# Patient Record
Sex: Female | Born: 1960 | Race: White | Hispanic: No | Marital: Married | Smoking: Never smoker
Health system: Southern US, Community
[De-identification: ages and names within clinical notes are randomized; demographics above are authoritative.]

## PROBLEM LIST (undated history)

## (undated) DIAGNOSIS — K219 Gastro-esophageal reflux disease without esophagitis: Secondary | ICD-10-CM

## (undated) DIAGNOSIS — I10 Essential (primary) hypertension: Secondary | ICD-10-CM

## (undated) DIAGNOSIS — E7800 Pure hypercholesterolemia, unspecified: Secondary | ICD-10-CM

## (undated) DIAGNOSIS — D649 Anemia, unspecified: Secondary | ICD-10-CM

## (undated) DIAGNOSIS — E119 Type 2 diabetes mellitus without complications: Secondary | ICD-10-CM

## (undated) HISTORY — DX: Essential (primary) hypertension: I10

## (undated) HISTORY — PX: ENDOMETRIAL ABLATION: SHX621

## (undated) HISTORY — DX: Pure hypercholesterolemia, unspecified: E78.00

## (undated) HISTORY — PX: BLADDER SUSPENSION: SHX72

## (undated) HISTORY — DX: Type 2 diabetes mellitus without complications: E11.9

## (undated) HISTORY — DX: Gastro-esophageal reflux disease without esophagitis: K21.9

## (undated) HISTORY — PX: CHOLECYSTECTOMY: SHX55

---

## 2001-08-26 ENCOUNTER — Emergency Department (HOSPITAL_COMMUNITY): Admission: EM | Admit: 2001-08-26 | Discharge: 2001-08-26 | Payer: Self-pay | Admitting: Internal Medicine

## 2001-08-26 ENCOUNTER — Encounter: Payer: Self-pay | Admitting: Internal Medicine

## 2001-12-28 ENCOUNTER — Encounter: Admission: RE | Admit: 2001-12-28 | Discharge: 2001-12-28 | Payer: Self-pay | Admitting: General Surgery

## 2001-12-28 ENCOUNTER — Encounter: Payer: Self-pay | Admitting: General Surgery

## 2002-08-20 ENCOUNTER — Encounter: Admission: RE | Admit: 2002-08-20 | Discharge: 2002-08-20 | Payer: Self-pay | Admitting: Internal Medicine

## 2002-08-20 ENCOUNTER — Encounter: Payer: Self-pay | Admitting: Internal Medicine

## 2003-12-18 ENCOUNTER — Encounter: Admission: RE | Admit: 2003-12-18 | Discharge: 2003-12-18 | Payer: Self-pay | Admitting: Internal Medicine

## 2005-06-23 ENCOUNTER — Encounter: Admission: RE | Admit: 2005-06-23 | Discharge: 2005-06-23 | Payer: Self-pay | Admitting: Internal Medicine

## 2005-07-08 ENCOUNTER — Ambulatory Visit (HOSPITAL_COMMUNITY): Admission: RE | Admit: 2005-07-08 | Discharge: 2005-07-08 | Payer: Self-pay | Admitting: Internal Medicine

## 2006-09-09 ENCOUNTER — Ambulatory Visit (HOSPITAL_COMMUNITY): Admission: RE | Admit: 2006-09-09 | Discharge: 2006-09-09 | Payer: Self-pay | Admitting: Internal Medicine

## 2006-12-30 ENCOUNTER — Encounter: Admission: RE | Admit: 2006-12-30 | Discharge: 2006-12-30 | Payer: Self-pay | Admitting: Internal Medicine

## 2008-02-02 ENCOUNTER — Encounter: Admission: RE | Admit: 2008-02-02 | Discharge: 2008-02-02 | Payer: Self-pay | Admitting: Internal Medicine

## 2010-02-02 ENCOUNTER — Encounter: Admission: RE | Admit: 2010-02-02 | Discharge: 2010-02-02 | Payer: Self-pay | Admitting: Obstetrics and Gynecology

## 2010-10-15 ENCOUNTER — Ambulatory Visit (HOSPITAL_COMMUNITY)
Admission: RE | Admit: 2010-10-15 | Discharge: 2010-10-15 | Payer: Self-pay | Source: Home / Self Care | Attending: Obstetrics and Gynecology | Admitting: Obstetrics and Gynecology

## 2010-12-28 LAB — HCG, SERUM, QUALITATIVE: Preg, Serum: NEGATIVE

## 2010-12-28 LAB — CBC
HCT: 41.6 % (ref 36.0–46.0)
Hemoglobin: 14.5 g/dL (ref 12.0–15.0)
MCH: 27.2 pg (ref 26.0–34.0)
MCHC: 34.9 g/dL (ref 30.0–36.0)
MCV: 78 fL (ref 78.0–100.0)
Platelets: 312 10*3/uL (ref 150–400)
RBC: 5.33 MIL/uL — ABNORMAL HIGH (ref 3.87–5.11)
RDW: 14.3 % (ref 11.5–15.5)
WBC: 9.8 10*3/uL (ref 4.0–10.5)

## 2010-12-28 LAB — BASIC METABOLIC PANEL
BUN: 10 mg/dL (ref 6–23)
CO2: 29 mEq/L (ref 19–32)
Calcium: 9 mg/dL (ref 8.4–10.5)
Calcium: 9.1 mg/dL (ref 8.4–10.5)
GFR calc Af Amer: >60 mL/min (ref >60)
Sodium: 138 mEq/L (ref 135–145)

## 2011-03-30 ENCOUNTER — Other Ambulatory Visit: Payer: Self-pay | Admitting: Internal Medicine

## 2011-03-30 DIAGNOSIS — Z1231 Encounter for screening mammogram for malignant neoplasm of breast: Secondary | ICD-10-CM

## 2011-04-09 ENCOUNTER — Ambulatory Visit
Admission: RE | Admit: 2011-04-09 | Discharge: 2011-04-09 | Disposition: A | Discharge status: Home / Self Care | Payer: 59 | Source: Ambulatory Visit | Attending: Internal Medicine | Admitting: Internal Medicine

## 2011-04-09 DIAGNOSIS — Z1231 Encounter for screening mammogram for malignant neoplasm of breast: Secondary | ICD-10-CM

## 2013-05-31 ENCOUNTER — Other Ambulatory Visit: Payer: Self-pay

## 2013-05-31 DIAGNOSIS — Z1231 Encounter for screening mammogram for malignant neoplasm of breast: Secondary | ICD-10-CM

## 2013-07-03 ENCOUNTER — Ambulatory Visit
Admission: RE | Admit: 2013-07-03 | Discharge: 2013-07-03 | Disposition: A | Discharge status: Home / Self Care | Payer: 59 | Source: Ambulatory Visit

## 2013-07-03 DIAGNOSIS — Z1231 Encounter for screening mammogram for malignant neoplasm of breast: Secondary | ICD-10-CM

## 2013-07-11 ENCOUNTER — Telehealth: Payer: Self-pay

## 2013-07-11 NOTE — Telephone Encounter (Signed)
P was referred by Dr. Regino Schultze for screening colonoscopy. LMOM for a return call.

## 2013-07-12 NOTE — Telephone Encounter (Signed)
Pt's husband called to schedule the appt since he will be driving her. I told him I need her to call so I can triage and then we can get with him on a date. He said he will have her call me.

## 2013-07-13 NOTE — Telephone Encounter (Signed)
Pt has OV with AS on 08/06/2013 at 3:00 Pm for diarrhea and to schedule TCS.

## 2013-08-06 ENCOUNTER — Ambulatory Visit (INDEPENDENT_AMBULATORY_CARE_PROVIDER_SITE_OTHER): Payer: 59 | Admitting: Gastroenterology

## 2013-08-06 ENCOUNTER — Encounter (INDEPENDENT_AMBULATORY_CARE_PROVIDER_SITE_OTHER): Payer: Self-pay

## 2013-08-06 ENCOUNTER — Encounter: Payer: Self-pay | Admitting: Gastroenterology

## 2013-08-06 VITALS — BP 134/83 | HR 76 | Temp 97.2°F | Ht 63.0 in | Wt 188.8 lb

## 2013-08-06 DIAGNOSIS — Z1211 Encounter for screening for malignant neoplasm of colon: Secondary | ICD-10-CM

## 2013-08-06 MED ORDER — PEG 3350-KCL-NA BICARB-NACL 420 G PO SOLR: 4000.0000 mL | ORAL | Status: DC

## 2013-08-06 MED ORDER — DICYCLOMINE HCL 10 MG PO CAPS: 10.0000 mg | ORAL_CAPSULE | Freq: Three times a day (TID) | ORAL | Status: DC

## 2013-08-06 NOTE — Assessment & Plan Note (Signed)
52 year old female with need for initial screening colonoscopy and noting a change in bowel habits over the past year to include postprandial urgency, alternating soft and liquid stool. No rectal bleeding; she has had no exposure to antibiotics or changes in medication. Doubt dealing with an infectious process currently. She reports completing stool studies in the recent past; we will obtain these for our records if possible.   Start Bentyl in the interim.  Proceed with colonoscopy with Dr. Darrick Penna in the near future. The risks, benefits, and alternatives have been discussed in detail with the patient. They state understanding and desire to proceed.  As of note: patient wants to ensure we know that it takes "just a little" to sedate her.

## 2013-08-06 NOTE — Progress Notes (Signed)
Referring Provider: Dr. Regino Schultze Primary Gastroenterologist:  Dr. Darrick Penna   Chief Complaint  Patient presents with  . Colonoscopy  . Diarrhea    HPI:   Holly Higgins presents today to discuss an initial screening colonoscopy. Bowel habits changed in past year. Used to go every other day but now once a day to a couple times a day. Concerned due to fecal urgency, suddenness of need to use bathroom. No changes to medications. Sometimes will have complete liquid stool. Consistency of stool varies. Usually some type of liquid involved. If she doesn't take Prilosec or Zantac, notices loose stool may be worse. Occasional abdominal cramping associated with bowel habits but not often. Postprandial urgency at times. Notices lettuce may be linked to bowel habits. No rectal bleeding. No weight loss or lack of appetite. No dysphagia. Doesn't come out of anesthesia well per patient. Takes awhile to "come around". Had backed off of fish oil but saw no changes.   Past Medical History  Diagnosis Date  . Hypertension   . Hypercholesterolemia   . GERD (gastroesophageal reflux disease)     Past Surgical History  Procedure Laterality Date  . Cholecystectomy      remote past  . Incontinence surgery    . Endometrial ablation      Current Outpatient Prescriptions  Medication Sig Dispense Refill  . cetirizine (ZYRTEC) 10 MG tablet Take 10 mg by mouth daily.      . cholecalciferol (VITAMIN D) 1000 UNITS tablet Take 1,000 Units by mouth daily.      . ferrous sulfate 325 (65 FE) MG tablet Take 325 mg by mouth daily with breakfast.      . fish oil-omega-3 fatty acids 1000 MG capsule Take 2 g by mouth daily.      . hydrochlorothiazide (HYDRODIURIL) 25 MG tablet Take 25 mg by mouth daily.      Marland Kitchen omeprazole (PRILOSEC OTC) 20 MG tablet Take 20 mg by mouth daily.      . potassium chloride SA (K-DUR,KLOR-CON) 20 MEQ tablet Take 20 mEq by mouth daily.        No current facility-administered medications for this  visit.    Allergies as of 08/06/2013 - Review Complete 08/06/2013  Allergen Reaction Noted  . Morphine and related  08/06/2013  . Penicillins  08/06/2013  . Statins  08/06/2013    Family History  Problem Relation Age of Onset  . Colon cancer Neg Hx     History   Social History  . Marital Status: Married    Spouse Name: N/A    Number of Children: N/A  . Years of Education: N/A   Occupational History  . employed     [employer]   Social History Main Topics  . Smoking status: Never Smoker   . Smokeless tobacco: Not on file  . Alcohol Use: No  . Drug Use: No  . Sexual Activity: Not on file   Other Topics Concern  . Not on file   Social History Narrative  . No narrative on file    Review of Systems: Gen: Denies any fever, chills, loss of appetite, fatigue, weight loss. CV: Denies chest pain, heart palpitations, syncope, peripheral edema.  Resp: Denies shortness of breath with rest, cough, wheezing GI: see HPI GU : Denies urinary burning, urinary frequency, urinary incontinence.  MS: Denies joint pain, muscle weakness, cramps, limited movement Derm: Denies rash, itching, dry skin Psych: Denies depression, anxiety, confusion or memory loss  Heme: Denies  bruising, bleeding, and enlarged lymph nodes.  Physical Exam: BP 134/83  Pulse 76  Temp(Src) 97.2 F (36.2 C) (Oral)  Ht 5\' 3"  (1.6 m)  Wt 188 lb 12.8 oz (85.639 kg)  BMI 33.45 kg/m2 General:   Alert and oriented. Well-developed, well-nourished, pleasant and cooperative. Head:  Normocephalic and atraumatic. Eyes:  Conjunctiva pink, sclera clear, no icterus.   Conjunctiva pink. Ears:  Normal auditory acuity. Nose:  No deformity, discharge,  or lesions. Mouth:  No deformity or lesions, mucosa pink and moist.  Neck:  Supple, without mass or thyromegaly. Lungs:  Clear to auscultation bilaterally, without wheezing, rales, or rhonchi.  Heart:  S1, S2 present without murmurs noted.  Abdomen:  +BS, soft,  non-tender and non-distended. Without mass or HSM. No rebound or guarding. No hernias noted. Rectal:  Deferred  Msk:  Symmetrical without gross deformities. Normal posture. Extremities:  Without clubbing or edema. Neurologic:  Alert and  oriented x4;  grossly normal neurologically. Skin:  Intact, warm and dry without significant lesions or rashes Cervical Nodes:  No significant cervical adenopathy. Psych:  Alert and cooperative. Normal mood and affect.

## 2013-08-06 NOTE — Patient Instructions (Signed)
Start taking Bentyl with meals up to 4 times a day. This is for abdominal cramping and loose stool after eating. Monitor for constipation, dry mouth, dizziness.   We have scheduled you for a colonoscopy with Dr. Darrick Penna in the near future.  Start taking a probiotic daily. Some examples are: Digestive Advantage, Phillip's Colon Health, [employer] brand, Align, or Restora.

## 2013-08-07 NOTE — Progress Notes (Signed)
cc'd to pcp 

## 2013-08-13 ENCOUNTER — Encounter (HOSPITAL_COMMUNITY): Payer: Self-pay | Admitting: Pharmacy Technician

## 2013-08-14 ENCOUNTER — Other Ambulatory Visit: Payer: Self-pay

## 2013-08-14 DIAGNOSIS — R197 Diarrhea, unspecified: Secondary | ICD-10-CM

## 2013-08-14 NOTE — Progress Notes (Signed)
Pt called and was informed.  

## 2013-08-14 NOTE — Progress Notes (Signed)
LMOM for a return call. Containers and orders at front for pick-up.

## 2013-08-14 NOTE — Progress Notes (Signed)
Unable to retrieve stool studies from PCP.   Let's update Cdiff PCR, stool culture, Giardia if possible. Colonoscopy scheduled mid November.

## 2013-08-24 LAB — STOOL CULTURE

## 2013-08-26 NOTE — Progress Notes (Signed)
Quick Note:  Stool studies all negative.  Proceed with colonoscopy as planned. ______

## 2013-08-27 NOTE — Progress Notes (Signed)
Quick Note:  Called and informed pt. ______ 

## 2013-08-28 ENCOUNTER — Encounter (HOSPITAL_COMMUNITY): Payer: Self-pay | Admitting: *Deleted

## 2013-08-28 ENCOUNTER — Encounter (HOSPITAL_COMMUNITY)
Admission: RE | Disposition: A | Discharge status: Home / Self Care | Payer: Self-pay | Source: Ambulatory Visit | Attending: Gastroenterology

## 2013-08-28 ENCOUNTER — Ambulatory Visit (HOSPITAL_COMMUNITY)
Admission: RE | Admit: 2013-08-28 | Discharge: 2013-08-28 | Disposition: A | Discharge status: Home / Self Care | Payer: 59 | Source: Ambulatory Visit | Attending: Gastroenterology | Admitting: Gastroenterology

## 2013-08-28 DIAGNOSIS — Z1211 Encounter for screening for malignant neoplasm of colon: Secondary | ICD-10-CM | POA: Insufficient documentation

## 2013-08-28 DIAGNOSIS — K648 Other hemorrhoids: Secondary | ICD-10-CM

## 2013-08-28 DIAGNOSIS — I10 Essential (primary) hypertension: Secondary | ICD-10-CM | POA: Insufficient documentation

## 2013-08-28 DIAGNOSIS — E7800 Pure hypercholesterolemia, unspecified: Secondary | ICD-10-CM | POA: Insufficient documentation

## 2013-08-28 DIAGNOSIS — D126 Benign neoplasm of colon, unspecified: Secondary | ICD-10-CM

## 2013-08-28 DIAGNOSIS — D179 Benign lipomatous neoplasm, unspecified: Secondary | ICD-10-CM | POA: Insufficient documentation

## 2013-08-28 DIAGNOSIS — R197 Diarrhea, unspecified: Secondary | ICD-10-CM

## 2013-08-28 HISTORY — DX: Anemia, unspecified: D64.9

## 2013-08-28 HISTORY — PX: COLONOSCOPY: SHX5424

## 2013-08-28 SURGERY — COLONOSCOPY
Anesthesia: Moderate Sedation

## 2013-08-28 MED ORDER — MIDAZOLAM HCL 5 MG/5ML IJ SOLN
INTRAMUSCULAR | Status: AC
  Filled 2013-08-28: qty 10

## 2013-08-28 MED ORDER — STERILE WATER FOR IRRIGATION IR SOLN
Status: DC | PRN
  Administered 2013-08-28: 09:00:00

## 2013-08-28 MED ORDER — MEPERIDINE HCL 100 MG/ML IJ SOLN
INTRAMUSCULAR | Status: AC
  Filled 2013-08-28: qty 2

## 2013-08-28 MED ORDER — SODIUM CHLORIDE 0.9 % IV SOLN
INTRAVENOUS | Status: DC
  Administered 2013-08-28: 09:00:00 via INTRAVENOUS

## 2013-08-28 MED ORDER — MEPERIDINE HCL 100 MG/ML IJ SOLN
INTRAMUSCULAR | Status: DC | PRN
  Administered 2013-08-28: 50 mg via INTRAVENOUS
  Administered 2013-08-28: 25 mg via INTRAVENOUS

## 2013-08-28 MED ORDER — MIDAZOLAM HCL 5 MG/5ML IJ SOLN
INTRAMUSCULAR | Status: DC | PRN
  Administered 2013-08-28: 1 mg via INTRAVENOUS
  Administered 2013-08-28 (×2): 2 mg via INTRAVENOUS

## 2013-08-28 NOTE — Op Note (Signed)
[facility] [address] [place], [address]   COLONOSCOPY PROCEDURE REPORT  PATIENT: Holly, Higgins  MR#: [id number] BIRTHDATE: 1961/03/18 , 52  yrs. old GENDER: Female ENDOSCOPIST: Jonette Eva, MD REFERRED [id number] Regino Schultze, M.D. PROCEDURE DATE:  08/28/2013 PROCEDURE:   Colonoscopy with snare polypectomy and Colonoscopy with biopsy INDICATIONS:unexplained diarrhea and Colorectal cancer screening. MEDICATIONS: Demerol 75 mg IV and Versed 5 mg IV  DESCRIPTION OF PROCEDURE:    Physical exam was performed.  Informed consent was obtained from the patient after explaining the benefits, risks, and alternatives to procedure.  The patient was connected to monitor and placed in left lateral position. Continuous oxygen was provided by nasal cannula and IV medicine administered through an indwelling cannula.  After administration of sedation and rectal exam, the patients rectum was intubated and the EC-3890Li ([id number])  colonoscope was advanced under direct visualization to the ileum.  The scope was removed slowly by carefully examining the color, texture, anatomy, and integrity mucosa on the way out. THE POLYP WAS RETRIEVED AND THE ROTH NET REMOVED.  AGAIN THE PATIENT rectum was intubated and the EC-3890Li ([id number])  colonoscope was advanced under direct visualization to the HEPATIC FLEXURE. The color, texture, anatomy, and integrity mucosa on the way out.  The patient was recovered in endoscopy and discharged home in satisfactory condition.    COLON FINDINGS: The mucosa appeared normal in the terminal ileum.  , A firm sessile polyp measuring 1 cm in size was found at the cecum. Multiple biopsies were performed using cold forceps.  A polypectomy was performed using snare cautery. THE POLYP WAS RETRIEVED USING THE ROTH NET.  The colon mucosa was otherwise normal.  , The colon IS redundant.  Manual abdominal counter-pressure was used to reach the cecum, and Moderate  sized internal hemorrhoids were found.  PREP QUALITY: good CECAL W/D TIME: 22 minutes    COMPLICATIONS: None  ENDOSCOPIC IMPRESSION: 1.   ONE COLON POLYP REMOVED 2.   The colon IS redundant 3.   Moderate sized internal hemorrhoids  RECOMMENDATIONS: FOLLOW A HIGH FIBER DIET.  AVOID ITEMS THAT CAUSE BLOATING AND GAS. BIOPSY RESULTS SHOULD BE BACK IN 7 DAYS.  Next colonoscopy in 3 YEARS IF SIMPLE ADENOMA AND 10 years IF INFLAMMATORY POLYP.       _______________________________ Rosalie DoctorJonette Eva, MD 08/28/2013 12:16 PM

## 2013-08-28 NOTE — H&P (Signed)
  Primary Care Physician:  Kirk Ruths, MD Primary Gastroenterologist:  Dr. Darrick Penna  Pre-Procedure History & Physical: HPI:  Holly Higgins is a 52 y.o. female here for COLON CANCER SCREENING.  Past Medical History  Diagnosis Date  . Hypertension   . Hypercholesterolemia   . GERD (gastroesophageal reflux disease)   . Anemia     Past Surgical History  Procedure Laterality Date  . Cholecystectomy      remote past  . Endometrial ablation    . Bladder suspension      Prior to Admission medications   Medication Sig Start Date End Date Taking? Authorizing Provider  dicyclomine (BENTYL) 10 MG capsule Take 1 capsule (10 mg total) by mouth 4 (four) times daily -  before meals and at bedtime. 08/06/13  Yes Nira Retort, NP  hydrochlorothiazide (HYDRODIURIL) 25 MG tablet Take 25 mg by mouth daily.   Yes Historical Provider, MD  omeprazole (PRILOSEC OTC) 20 MG tablet Take 20 mg by mouth daily.   Yes Historical Provider, MD  polyethylene glycol-electrolytes (TRILYTE) 420 G solution Take 4,000 mLs by mouth as directed. 08/06/13  Yes West Bali, MD  cetirizine (ZYRTEC) 10 MG tablet Take 10 mg by mouth daily.    Historical Provider, MD  ferrous sulfate 325 (65 FE) MG tablet Take 325 mg by mouth daily with breakfast.    Historical Provider, MD  fish oil-omega-3 fatty acids 1000 MG capsule Take 2 g by mouth daily.    Historical Provider, MD  potassium chloride SA (K-DUR,KLOR-CON) 20 MEQ tablet Take 20 mEq by mouth daily.  06/01/13   Historical Provider, MD  VITAMIN D, CHOLECALCIFEROL, PO Take 1 tablet by mouth daily.    Historical Provider, MD    Allergies as of 08/06/2013 - Review Complete 08/06/2013  Allergen Reaction Noted  . Morphine and related  08/06/2013  . Penicillins  08/06/2013  . Statins  08/06/2013    Family History  Problem Relation Age of Onset  . Colon cancer Neg Hx     History   Social History  . Marital Status: Married    Spouse Name: N/A    Number of Children:  N/A  . Years of Education: N/A   Occupational History  . employed     [employer]   Social History Main Topics  . Smoking status: Never Smoker   . Smokeless tobacco: Not on file  . Alcohol Use: No  . Drug Use: No  . Sexual Activity: Not on file   Other Topics Concern  . Not on file   Social History Narrative  . No narrative on file    Review of Systems: See HPI, otherwise negative ROS   Physical Exam: BP 127/77  Pulse 75  Temp(Src) 97.5 F (36.4 C) (Oral)  Resp 16  Ht 5\' 3"  (1.6 m)  Wt 188 lb (85.276 kg)  BMI 33.31 kg/m2  SpO2 95% General:   Alert,  pleasant and cooperative in NAD Head:  Normocephalic and atraumatic. Neck:  Supple; Lungs:  Clear throughout to auscultation.    Heart:  Regular rate and rhythm. Abdomen:  Soft, nontender and nondistended. Normal bowel sounds, without guarding, and without rebound.   Neurologic:  Alert and  oriented x4;  grossly normal neurologically.  Impression/Plan:     SCREENING  Plan:  1. TCS TODAY

## 2013-08-30 ENCOUNTER — Encounter (HOSPITAL_COMMUNITY): Payer: Self-pay | Admitting: Gastroenterology

## 2013-09-05 ENCOUNTER — Telehealth: Payer: Self-pay | Admitting: Gastroenterology

## 2013-09-05 NOTE — Telephone Encounter (Signed)
Please call pt. She had a polypoid lesion, removed and it was benign. TCS in 10 years. FOLLOW A High fiber diet.

## 2013-09-06 NOTE — Telephone Encounter (Signed)
Pt returned call and was informed.  

## 2013-09-06 NOTE — Telephone Encounter (Signed)
Cc PCP 

## 2013-09-06 NOTE — Telephone Encounter (Signed)
LM for pt to call

## 2014-04-16 ENCOUNTER — Other Ambulatory Visit (HOSPITAL_COMMUNITY): Payer: Self-pay | Admitting: Family Medicine

## 2014-04-16 ENCOUNTER — Encounter (HOSPITAL_COMMUNITY): Payer: Self-pay

## 2014-04-16 ENCOUNTER — Ambulatory Visit (HOSPITAL_COMMUNITY)
Admission: RE | Admit: 2014-04-16 | Discharge: 2014-04-16 | Disposition: A | Discharge status: Home / Self Care | Payer: 59 | Source: Ambulatory Visit | Attending: Family Medicine | Admitting: Family Medicine

## 2014-04-16 DIAGNOSIS — G43809 Other migraine, not intractable, without status migrainosus: Secondary | ICD-10-CM

## 2014-04-16 DIAGNOSIS — G43009 Migraine without aura, not intractable, without status migrainosus: Secondary | ICD-10-CM

## 2014-04-16 DIAGNOSIS — G43909 Migraine, unspecified, not intractable, without status migrainosus: Secondary | ICD-10-CM | POA: Insufficient documentation

## 2014-06-14 ENCOUNTER — Other Ambulatory Visit: Payer: Self-pay

## 2014-06-14 DIAGNOSIS — Z1231 Encounter for screening mammogram for malignant neoplasm of breast: Secondary | ICD-10-CM

## 2014-07-15 ENCOUNTER — Ambulatory Visit
Admission: RE | Admit: 2014-07-15 | Discharge: 2014-07-15 | Disposition: A | Discharge status: Home / Self Care | Payer: 59 | Source: Ambulatory Visit

## 2014-07-15 DIAGNOSIS — Z1231 Encounter for screening mammogram for malignant neoplasm of breast: Secondary | ICD-10-CM

## 2014-09-01 ENCOUNTER — Other Ambulatory Visit: Payer: Self-pay | Admitting: Gastroenterology

## 2014-09-15 IMAGING — CT CT HEAD W/O CM
1 series · 16 of 30 positions shown, 20 images · non-contrast
Comparison: CT brain scan of 09/09/2006

CLINICAL DATA: Migraine headaches, photosensitivity

EXAM:
CT HEAD WITHOUT CONTRAST
TECHNIQUE: Contiguous axial images were obtained from the base of the skull
through the vertex without intravenous contrast.

[Series 2: headseq 4.8 h37s · axial · 0.43mm/px · z∈[+1026,+1184]mm · 16 of 36 slices shown, 20 images]
[im 2/36  brain]
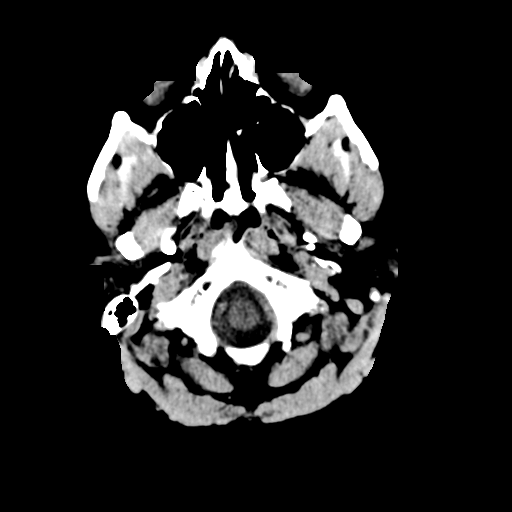
[im 2/36  bone]
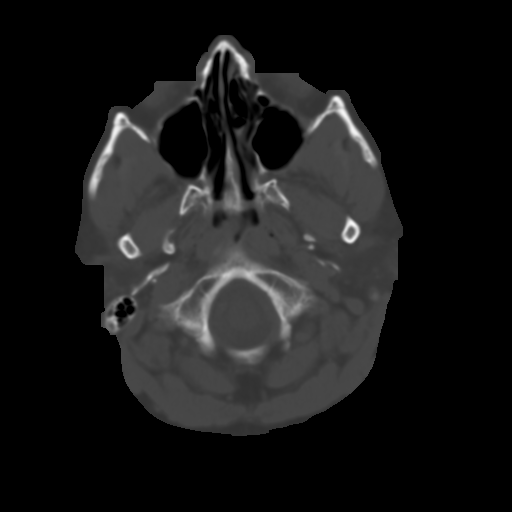
[im 4/36  brain]
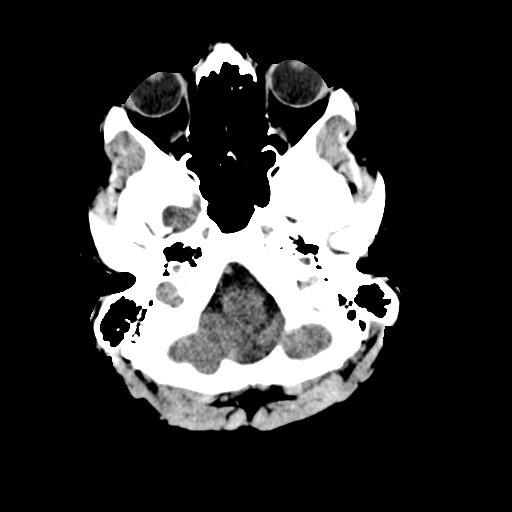
[im 7/36  brain]
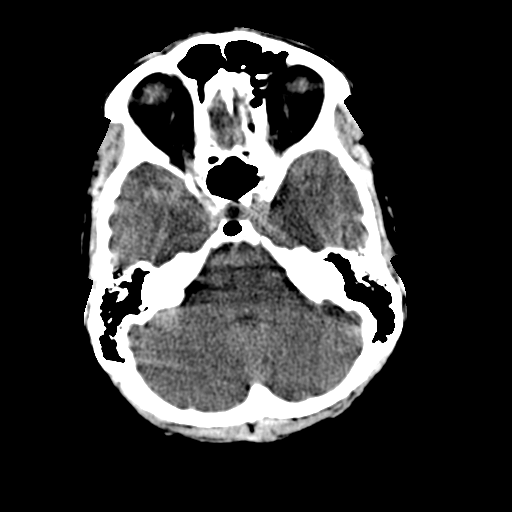
[im 9/36  brain]
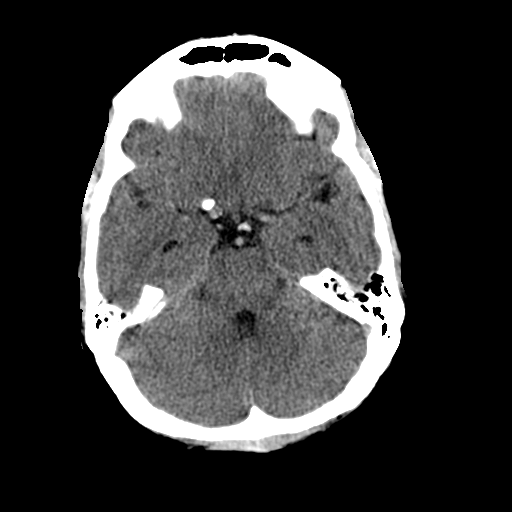
[im 10/36  brain]
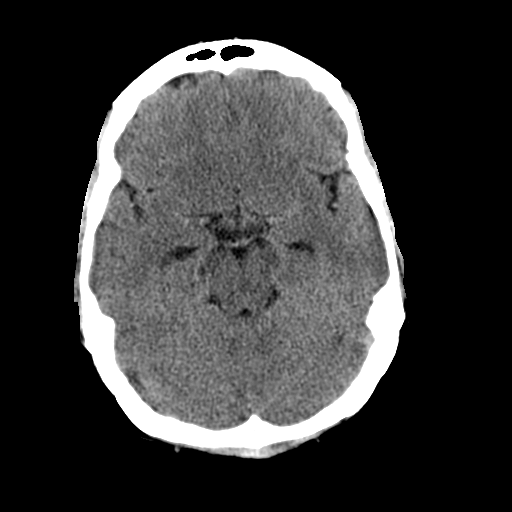
[im 10/36  bone]
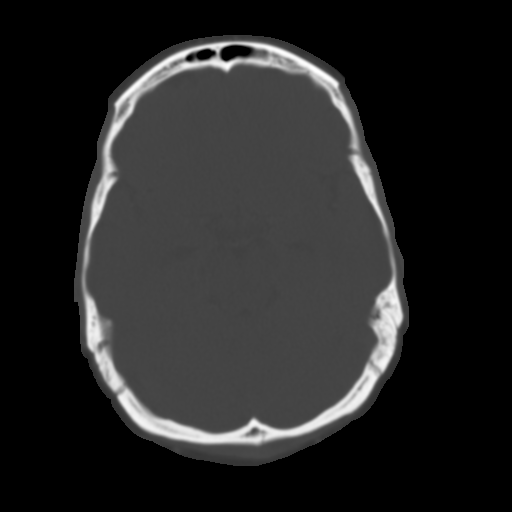
[im 13/36  brain]
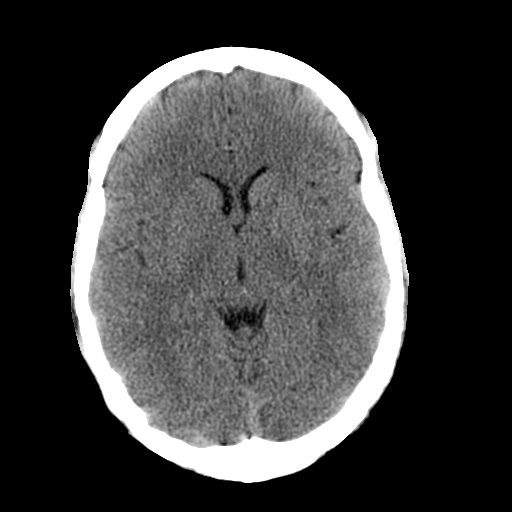
[im 15/36  brain]
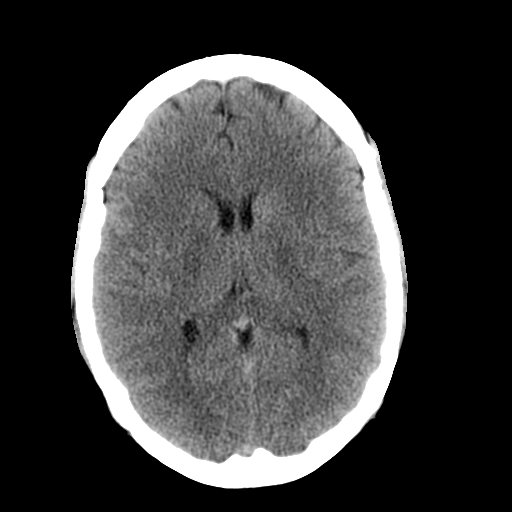
[im 17/36  brain]
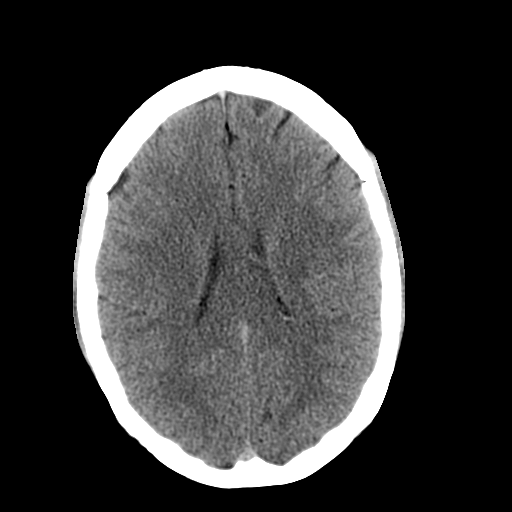
[im 19/36  brain]
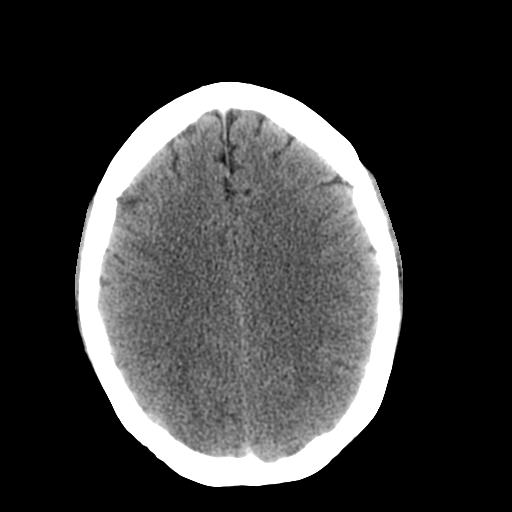
[im 19/36  bone]
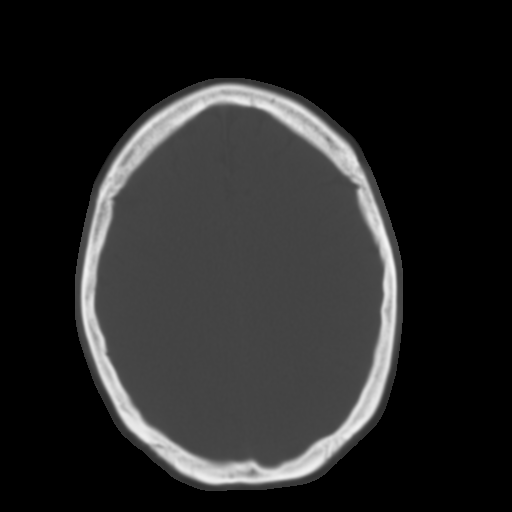
[im 21/36  brain]
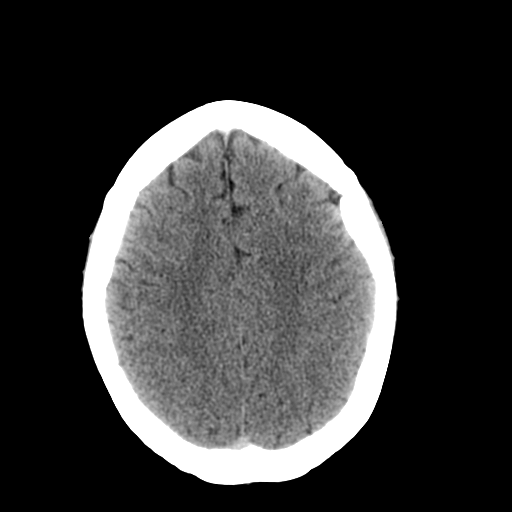
[im 23/36  brain]
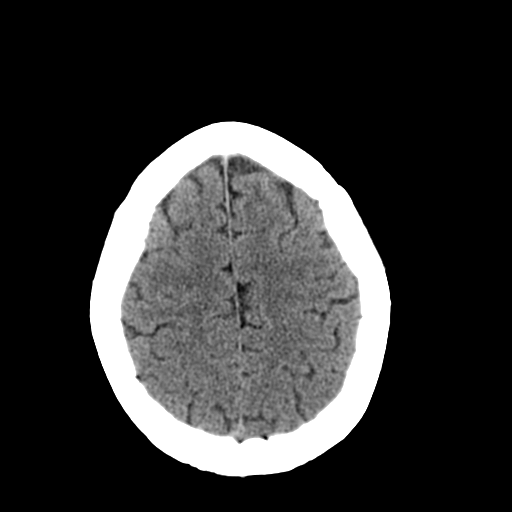
[im 26/36  brain]
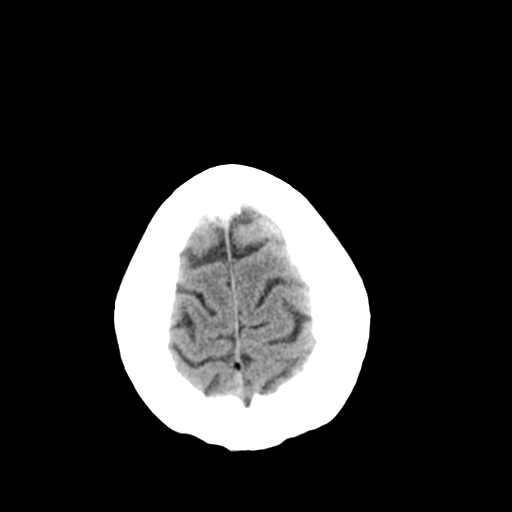
[im 27/36  brain]
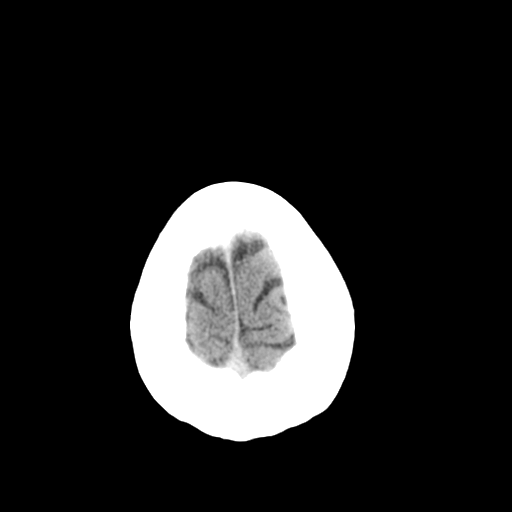
[im 27/36  bone]
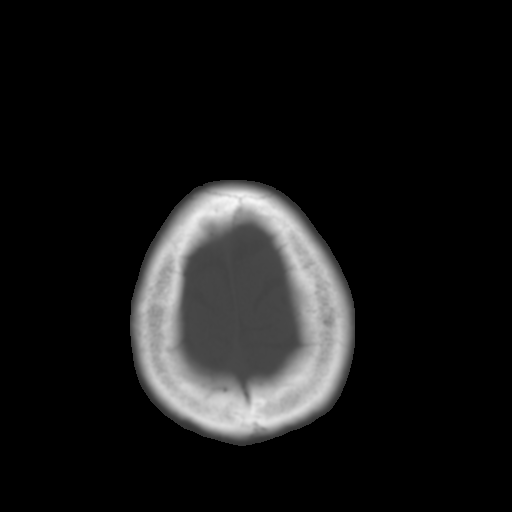
[im 29/36  brain]
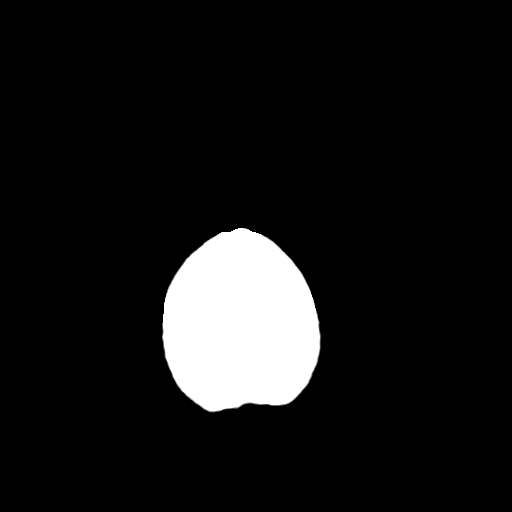
[im 32/36  brain]
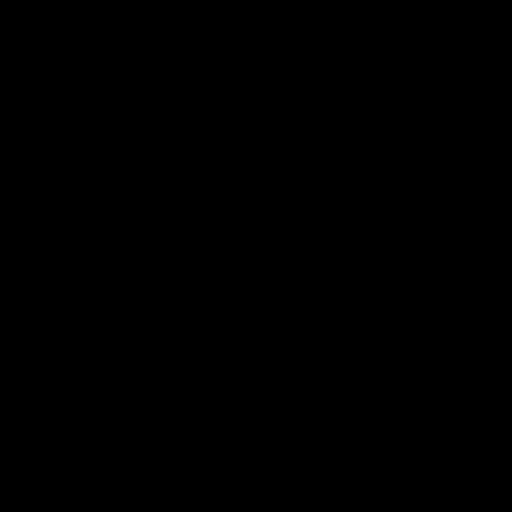
[im 34/36  brain]
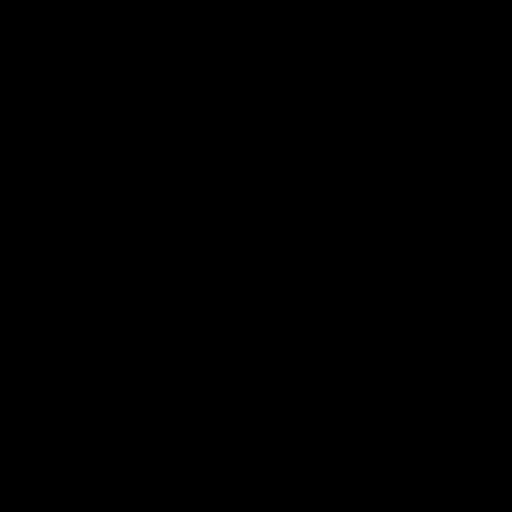

[16 of 30 positions shown; findings below may reference images not displayed]

FINDINGS: The ventricular system is normal in size and configuration and the
septum is in a normal midline position. The fourth ventricle and
basilar cisterns are unremarkable. No hemorrhage, mass lesion, or
acute infarction is seen. On bone window images, no calvarial
abnormality is noted. The paranasal sinuses appear well pneumatized.
IMPRESSION: Negative unenhanced CT of the brain.

## 2014-12-06 ENCOUNTER — Other Ambulatory Visit (HOSPITAL_COMMUNITY): Payer: Self-pay | Admitting: Internal Medicine

## 2014-12-06 DIAGNOSIS — R0600 Dyspnea, unspecified: Secondary | ICD-10-CM

## 2014-12-07 ENCOUNTER — Ambulatory Visit (HOSPITAL_COMMUNITY): Payer: Self-pay

## 2015-02-04 ENCOUNTER — Institutional Professional Consult (permissible substitution): Payer: Self-pay | Admitting: Neurology

## 2015-03-12 ENCOUNTER — Other Ambulatory Visit (HOSPITAL_COMMUNITY): Payer: Self-pay | Admitting: Radiology

## 2015-03-12 DIAGNOSIS — G4730 Sleep apnea, unspecified: Secondary | ICD-10-CM

## 2015-03-28 ENCOUNTER — Ambulatory Visit: Payer: 59 | Attending: Neurology | Admitting: Sleep Medicine

## 2015-03-28 DIAGNOSIS — G4730 Sleep apnea, unspecified: Secondary | ICD-10-CM | POA: Insufficient documentation

## 2015-03-29 NOTE — Sleep Study (Signed)
  [place] A. Merlene Laughter, MD     [email or web address]        NOCTURNAL POLYSOMNOGRAM    LOCATION: SLEEP LAB FACILITY: [facility]   PHYSICIAN: Loukas Antonson A. Merlene Laughter, M.D.   DATE OF STUDY: 03/28/2015.   REFERRING PHYSICIAN: Chade Pitner.   INDICATIONS: The patient is a 54 year old presents with hypersomnia, obesity and snoring.  MEDICATIONS:  Prior to Admission medications   Medication Sig Start Date End Date Taking? Authorizing Provider  cetirizine (ZYRTEC) 10 MG tablet Take 10 mg by mouth daily.    Historical Provider, MD  dicyclomine (BENTYL) 10 MG capsule TAKE 1 CAPSULE BY MOUTH FOUR TIMES DAILY - BEFORE MEALS AND AT BEDTIME 09/02/14   Mahala Menghini, PA-C  ferrous sulfate 325 (65 FE) MG tablet Take 325 mg by mouth daily with breakfast.    Historical Provider, MD  fish oil-omega-3 fatty acids 1000 MG capsule Take 2 g by mouth daily.    Historical Provider, MD  hydrochlorothiazide (HYDRODIURIL) 25 MG tablet Take 25 mg by mouth daily.    Historical Provider, MD  omeprazole (PRILOSEC OTC) 20 MG tablet Take 20 mg by mouth daily.    Historical Provider, MD  potassium chloride SA (K-DUR,KLOR-CON) 20 MEQ tablet Take 20 mEq by mouth daily.  06/01/13   Historical Provider, MD  VITAMIN D, CHOLECALCIFEROL, PO Take 1 tablet by mouth daily.    Historical Provider, MD      EPWORTH SLEEPINESS SCALE: 11.   BMI: 35.   ARCHITECTURAL SUMMARY: Total recording time was 409 minutes. Sleep efficiency 92 %. Sleep latency 6 minutes. REM latency 94 minutes. Stage NI 1 %, N2 48 % and N3 27 % and REM sleep 23 %.    RESPIRATORY DATA:  Baseline oxygen saturation is 94 %. The lowest saturation is 84 %. The diagnostic AHI is 24. The RDI is 25. The REM AHI is 31.  LIMB MOVEMENT SUMMARY: PLM index 0.   ELECTROCARDIOGRAM SUMMARY: Average heart rate is 74 with no significant dysrhythmias observed.   IMPRESSION:  1. Moderate obstructive sleep apnea syndrome. CPAP therapy is  recommended.  Thanks for this referral.  Antwanette Wesche A. Merlene Laughter, M.D. Diplomat, Tax adviser of Sleep Medicine.

## 2015-05-09 NOTE — Progress Notes (Signed)
REVIEWED. TCS NOV 2014

## 2017-01-05 ENCOUNTER — Other Ambulatory Visit: Payer: Self-pay | Admitting: Gastroenterology

## 2017-01-05 NOTE — Telephone Encounter (Signed)
I called pt and informed her. She said she will call back to schedule appt when she has her calendar in front of her.

## 2017-01-05 NOTE — Telephone Encounter (Signed)
Patient last seen in 2014. No refills without ov.

## 2020-04-29 ENCOUNTER — Other Ambulatory Visit: Payer: Self-pay

## 2020-04-29 ENCOUNTER — Ambulatory Visit (INDEPENDENT_AMBULATORY_CARE_PROVIDER_SITE_OTHER): Payer: Commercial Managed Care - PPO | Admitting: Internal Medicine

## 2020-04-29 ENCOUNTER — Encounter: Payer: Self-pay | Admitting: Internal Medicine

## 2020-04-29 VITALS — BP 128/80 | HR 63 | Ht 63.0 in | Wt 194.0 lb

## 2020-04-29 DIAGNOSIS — I10 Essential (primary) hypertension: Secondary | ICD-10-CM

## 2020-04-29 DIAGNOSIS — Z01812 Encounter for preprocedural laboratory examination: Secondary | ICD-10-CM

## 2020-04-29 DIAGNOSIS — E119 Type 2 diabetes mellitus without complications: Secondary | ICD-10-CM | POA: Diagnosis not present

## 2020-04-29 DIAGNOSIS — R072 Precordial pain: Secondary | ICD-10-CM

## 2020-04-29 DIAGNOSIS — E7849 Other hyperlipidemia: Secondary | ICD-10-CM | POA: Diagnosis not present

## 2020-04-29 MED ORDER — METFORMIN HCL ER 500 MG PO TB24: 500.0000 mg | ORAL_TABLET | Freq: Two times a day (BID) | ORAL | 11 refills | Status: AC

## 2020-04-29 MED ORDER — METOPROLOL TARTRATE 50 MG PO TABS: ORAL_TABLET | ORAL | 0 refills | Status: DC

## 2020-04-29 NOTE — Patient Instructions (Signed)
Medication Instructions:  Dr. Debara Pickett recommends Repatha 130m/mL (PCSK9). This is an injectable cholesterol medication self-administered once every 14 days. This medication will likely need prior approval with your insurance company, which we will work on. If the medication is not approved initially, we may need to do an appeal with your insurance. We will keep you updated on this process.   Administer medication in area of fatty tissue such as abdomen, outer thigh, back up of arm - and rotate site with each injection Store medication in refrigerator until ready to administer - allow to sit at room temp for 30 mins - 1 hour prior to injection Dispose of medication in a SHARPS container - your pharmacy should be able to direct you on this and proper disposal   If you need co-pay assistance grant, please look into the program at healthwellfoundation.org >> disease funds >> hypercholesterolemia. This is an online application or you can call to complete. Once approved, you will provide the "pharmacy card" information to your pharmacy and they will deduct the co-pays from this grant.  If you need a co-pay card for Repatha: rhttp://aguilar-moyer.com/>> paying for Repatha or red box that says "Repatha Copay Card" in top right If you need a co-pay card for Praluent: pWedMap.it>> starting & paying for Praluent  Dr. HDebara Pickettrecommends that you start Metformin 5035mtwice daily - OK to start after CT test  *If you need a refill on your cardiac medications before your next appointment, please call your pharmacy*   Lab Work: BMET due 1 week prior to CT test  Fasting LIPID PANEL due 1 week prior to next visit with Dr. HiDebara Pickett If you have labs (blood work) drawn today and your tests are completely normal, you will receive your results only by: . Marland KitchenyChart Message (if you have MyChart) OR . A paper copy in the mail If you have any lab test that is abnormal or we need to change your treatment, we will call you to review the  results.   Testing/Procedures: [facility] will be approved with insurance first and then you will be called to schedule If there are abnormalities on test, we will have you follow up sooner   Follow-Up: [facility] are our [facility].  As part of our continuing mission to provide you with exceptional heart care, we have created designated Provider Care Teams.  These Care Teams include your primary Cardiologist (physician) and Advanced Practice Providers (APPs -  Physician Assistants and Nurse Practitioners) who all work together to provide you with the care you need, when you need it.  We recommend signing up for the patient portal called "MyChart".  Sign up information is provided on this After Visit Summary.  MyChart is used to connect with patients for Virtual Visits (Telemedicine).  Patients are able to view lab/test results, encounter notes, upcoming appointments, etc.  Non-urgent messages can be sent to your provider as well.   To learn more about what you can do with MyChart, go to [email or web address]   Your next appointment:   3-4 month(s)  The format for your next appointment:   In Person  Provider:   K.Raliegh IphMaliilty, MD   Other Instructions  Your cardiac CT will be scheduled at one of the below locations:   [facility] [phone]  [place], [place] [address] [phone]  If scheduled at [facility], please arrive at the [facility] main entrance of [facility] 30 minutes prior to test start time. Proceed to the [facility] Radiology Department (first floor) to check-in and test prep.  If scheduled at [facility], please arrive 15 mins early for check-in and test prep.  Please follow these instructions carefully (unless otherwise  directed):  Hold all erectile dysfunction medications at least 3 days (72 hrs) prior to test.  On the Night Before the Test: . Be sure to Drink plenty of water. . Do not consume any caffeinated/decaffeinated beverages or chocolate 12 hours prior to your test. . Do not take any antihistamines 12 hours prior to your test.  On the Day of the Test: . Drink plenty of water. Do not drink any water within one hour of the test. . Do not eat any food 4 hours prior to the test. . You may take your regular medications prior to the test.  . Take metoprolol (Lopressor) two hours prior to test. . HOLD Hydrochlorothiazide morning of the test. . FEMALES- please wear underwire-free bra if available      After the Test: . Drink plenty of water. . After receiving IV contrast, you may experience a mild flushed feeling. This is normal. . On occasion, you may experience a mild rash up to 24 hours after the test. This is not dangerous. If this occurs, you can take Benadryl 25 mg and increase your fluid intake. . If you experience trouble breathing, this can be serious. If it is severe call 911 IMMEDIATELY. If it is mild, please call our office. . If you take any of these medications: Glipizide/Metformin, Avandament, Glucavance, please do not take 48 hours after completing test unless otherwise instructed.   Once we have confirmed authorization from your insurance company, we will call you to set up a date and time for your test. Based on how quickly your insurance processes prior authorizations requests, please allow up to 4 weeks to be contacted for scheduling your Cardiac CT appointment. Be advised that routine Cardiac CT appointments could be scheduled as many as 8 weeks after your provider has ordered it.  For non-scheduling related questions, please contact the cardiac imaging nurse navigator should you have any questions/concerns: Marchia Bond, Cardiac Imaging Nurse Navigator Burley Saver, Interim Cardiac  Imaging Nurse [facility] Direct Office Dial: [phone]   For scheduling needs, including cancellations and rescheduling, please call Vivien Rota at [phone], option 3.

## 2020-04-29 NOTE — Progress Notes (Signed)
LIPID CLINIC CONSULT NOTE  Chief Complaint:  High cholesterol, fatigue, shortness of breath  Primary Care Physician: Holly School, MD  Primary Cardiologist:  No primary care provider on file.  HPI:  Holly Higgins is a 59 y.o. female who is being seen today for the evaluation of high cholesterol, fatigue, shortness of breath at the request of Holly School, MD.  This is a pleasant 59 year old female kindly referred by Dr. Gerarda Higgins for evaluation of high cholesterol who also has had progressive shortness of breath, chest pain and fatigue for the past 6 months.  She has a strong family history of coronary disease in her father and grandfather.  She reports longstanding high cholesterol but unfortunately has been intolerant to statins causing myalgias and joint pain.  She is currently on no therapy.  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  Hemoglobin A1c was also noted to be 7.4 although she carries no diagnosis of diabetes this would indicate a new diagnosis.  She did have an episode of elevated blood glucose which was treated by her husband who is also diabetic and paramedic as he provided her with some of his insulin to lower the sugar.  I did strongly advise against this.  [name] reports that she has had some progressive dyspnea and fatigue over the past 29months with decreased energy and exercise tolerance, concerning for possible coronary disease.  Diet is not ideal and could be improved with decreases in sugars and saturated fats.  In addition weight loss would be helpful.  PMHx:  Past Medical History:  Diagnosis Date   Anemia    GERD (gastroesophageal reflux disease)    Hypercholesterolemia    Hypertension     Past Surgical History:  Procedure Laterality Date   BLADDER SUSPENSION     CHOLECYSTECTOMY     remote past   COLONOSCOPY N/A  08/28/2013   Procedure: COLONOSCOPY;  Surgeon: Danie Binder, MD;  [facility];  Service: Endoscopy;  Laterality: N/A;  9:00   ENDOMETRIAL ABLATION      FAMHx:  Family History  Problem Relation Age of Onset   Colon cancer Neg Hx     SOCHx:   reports that she has never smoked. She does not have any smokeless tobacco history on file. She reports that she does not drink alcohol and does not use drugs.  ALLERGIES:  Allergies  Allergen Reactions   Erythromycin Other (See Comments)    Chest Pain    Latex Itching and Swelling   Morphine And Related Nausea And Vomiting   Statins     Muscle aches, joint pain   Banana Nausea And Vomiting, Swelling and Rash   Penicillins Swelling and Rash    ROS: Pertinent items noted in HPI and remainder of comprehensive ROS otherwise negative.  HOME MEDS: Current Outpatient Medications on File Prior to Visit  Medication Sig Dispense Refill   cetirizine (ZYRTEC) 10 MG tablet Take 10 mg by mouth daily.     hydrochlorothiazide (HYDRODIURIL) 25 MG tablet Take 25 mg by mouth daily.     montelukast (SINGULAIR) 10 MG tablet Take 10 mg by mouth daily.     omeprazole (PRILOSEC OTC) 20 MG tablet Take 20 mg by mouth daily.     potassium chloride SA (K-DUR,KLOR-CON) 20 MEQ tablet Take 20 mEq by mouth daily.      VITAMIN  D, CHOLECALCIFEROL, PO Take 1 tablet by mouth daily.     No current facility-administered medications on file prior to visit.    LABS/IMAGING: No results found for this or any previous visit (from the past 48 hour(s)). No results found.  LIPID PANEL: No results found for: CHOL, TRIG, HDL, CHOLHDL, VLDL, LDLCALC, LDLDIRECT  WEIGHTS: Wt Readings from Last 3 Encounters:  04/29/20 194 lb (88 kg)  03/28/15 193 lb (87.5 kg)  08/28/13 188 lb (85.3 kg)    VITALS: BP 128/80    Pulse 63    Ht 5\' 3"  (1.6 m)    Wt 194 lb (88 kg)    SpO2 97%    BMI 34.37 kg/m   EXAM: General appearance: alert, no distress and  moderately obese Neck: no carotid bruit, no JVD and thyroid not enlarged, symmetric, no tenderness/mass/nodules Lungs: clear to auscultation bilaterally Heart: regular rate and rhythm, S1, S2 normal, no murmur, click, rub or gallop Abdomen: soft, non-tender; bowel sounds normal; no masses,  no organomegaly and obese Extremities: extremities normal, atraumatic, no cyanosis or edema and Significant lipedema Pulses: 2+ and symmetric Skin: Skin color, texture, turgor normal. No rashes or lesions Neurologic: Grossly normal Psych: Pleasant  EKG: Deferred  ASSESSMENT: 1. 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 Progressive fatigue, exercise intolerance and chest discomfort 3. New onset diabetes-hemoglobin A1c 7.4 4. Essential hypertension 5. Moderate obesity 6. Strong family history of coronary disease 7. Statin intolerance-myalgias  PLAN: 1.   [name] has a significant dyslipidemia which is likely genetic.  In addition there is new onset diabetes which is untreated.  I am concerned about her progressive fatigue, exercise intolerance and chest discomfort which is intermittent but has been progressive over the past 6 months.  This highly concerning for possible underlying coronary disease.  I would recommend a CT coronary angiogram to further evaluate for the presence of coronary disease or obstructive coronary disease.  In addition unfortunately as she has been intolerant of statins due to myalgias, would recommend starting a PCSK9 inhibitor, especially given her LDL cholesterol greater than 190.  I would also recommend starting treatment for her diabetes with Metformin extended release 500 mg twice daily.  She may be a good candidate to add one of the newer medications such as a GLP-1 or SGLT2 inhibitor.  We will plan repeat lipids in about 3 months and I will contact her with results of her angiogram.  If further evaluation and possible  cardiac catheterization is necessary we will proceed with that sooner.  Thanks again for the kind referral.  Pixie Casino, MD, FACC, [place] Director of the Advanced Lipid Disorders &  Cardiovascular Risk Reduction Clinic Diplomate of the American Board of Clinical Lipidology Attending Cardiologist  Direct Dial: [phone]   Fax: [phone]  Website:  [email or web address]  Nadean Corwin Jamicah Anstead 04/29/2020, 5:27 PM

## 2020-04-30 ENCOUNTER — Telehealth: Payer: Self-pay | Admitting: Internal Medicine

## 2020-04-30 MED ORDER — REPATHA SURECLICK 140 MG/ML ~~LOC~~ SOAJ: 1.0000 | SUBCUTANEOUS | 3 refills | Status: DC

## 2020-04-30 NOTE — Telephone Encounter (Signed)
PA for repatha submitted via CMM (Key: [id number])

## 2020-04-30 NOTE — Telephone Encounter (Signed)
Request Reference Number: [id number]. REPATHA SURE INJ 140MG /ML is approved through 10/31/2020

## 2020-04-30 NOTE — Telephone Encounter (Signed)
LM for patient that med has been approved. Rx sent to [facility]

## 2020-04-30 NOTE — Addendum Note (Signed)
Addended by: Fidel Levy on: 04/30/2020 02:05 PM   Modules accepted: Orders

## 2020-05-16 LAB — BASIC METABOLIC PANEL
BUN/Creatinine Ratio: 13 (ref 9–23)
BUN: 11 mg/dL (ref 6–24)
CO2: 29 mmol/L (ref 20–29)
Calcium: 9.6 mg/dL (ref 8.7–10.2)
Chloride: 93 mmol/L — ABNORMAL LOW (ref 96–106)
Creatinine, Ser: 0.86 mg/dL (ref 0.57–1.00)
GFR calc Af Amer: 86 mL/min/{1.73_m2} (ref >59)
GFR calc non Af Amer: 75 mL/min/{1.73_m2} (ref >59)
Glucose: 278 mg/dL — ABNORMAL HIGH (ref 65–99)
Potassium: 3.5 mmol/L (ref 3.5–5.2)
Sodium: 138 mmol/L (ref 134–144)

## 2020-05-21 ENCOUNTER — Telehealth (HOSPITAL_COMMUNITY): Payer: Self-pay | Admitting: *Deleted

## 2020-05-21 NOTE — Telephone Encounter (Signed)
Reaching out to patient to offer assistance regarding upcoming cardiac imaging study; pt verbalizes understanding of appt date/time, parking situation and where to check in, pre-test NPO status and medications ordered, and verified current allergies; name and call back number provided for further questions should they arise ° °Cassadee Vanzandt Tai RN Navigator Cardiac Imaging °[facility] °[phone] office °[phone] cell ° °

## 2020-05-21 NOTE — Telephone Encounter (Signed)
Attempted to call patient regarding upcoming cardiac CT appointment. Left message on voicemail with name and callback number  Rakhi Romagnoli Tai RN Navigator Cardiac Imaging [facility] [phone] Office [phone] Cell 

## 2020-05-22 ENCOUNTER — Encounter: Payer: Commercial Managed Care - PPO | Admitting: *Deleted

## 2020-05-22 ENCOUNTER — Ambulatory Visit (HOSPITAL_COMMUNITY)
Admission: RE | Admit: 2020-05-22 | Discharge: 2020-05-22 | Disposition: A | Discharge status: Home / Self Care | Payer: Commercial Managed Care - PPO | Source: Ambulatory Visit | Attending: Internal Medicine | Admitting: Internal Medicine

## 2020-05-22 ENCOUNTER — Other Ambulatory Visit: Payer: Self-pay

## 2020-05-22 DIAGNOSIS — R072 Precordial pain: Secondary | ICD-10-CM | POA: Insufficient documentation

## 2020-05-22 DIAGNOSIS — Z006 Encounter for examination for normal comparison and control in clinical research program: Secondary | ICD-10-CM

## 2020-05-22 MED ORDER — NITROGLYCERIN 0.4 MG SL SUBL
0.8000 mg | SUBLINGUAL_TABLET | Freq: Once | SUBLINGUAL | Status: AC
  Administered 2020-05-22: 0.8 mg via SUBLINGUAL

## 2020-05-22 MED ORDER — IOHEXOL 350 MG/ML SOLN
80.0000 mL | Freq: Once | INTRAVENOUS | Status: AC | PRN
  Administered 2020-05-22: 80 mL via INTRAVENOUS

## 2020-05-22 MED ORDER — NITROGLYCERIN 0.4 MG SL SUBL
SUBLINGUAL_TABLET | SUBLINGUAL | Status: AC
  Filled 2020-05-22: qty 2

## 2020-05-22 NOTE — Research (Signed)
CADFEM Informed Consent                  Subject Name:   Holly Higgins. Holly Higgins   Subject met inclusion and exclusion criteria.  The informed consent form, study requirements and expectations were reviewed with the subject and questions and concerns were addressed prior to the signing of the consent form.  The subject verbalized understanding of the trial requirements.  The subject agreed to participate in the CADFEM trial and signed the informed consent.  The informed consent was obtained prior to performance of any protocol-specific procedures for the subject.  A copy of the signed informed consent was given to the subject and a copy was placed in the subject's medical record.   [place] Sheika Coutts, Research Assistant   05/22/2020 11:16a.m.

## 2020-05-28 ENCOUNTER — Telehealth: Payer: Self-pay | Admitting: Internal Medicine

## 2020-05-28 DIAGNOSIS — E119 Type 2 diabetes mellitus without complications: Secondary | ICD-10-CM

## 2020-05-28 MED ORDER — BLOOD GLUCOSE MONITOR KIT: PACK | 0 refills | Status: DC

## 2020-05-28 NOTE — Telephone Encounter (Signed)
Patient is returning call to discuss results from CT completed on 05/22/20.

## 2020-05-28 NOTE — Telephone Encounter (Signed)
Yes - ok to order home glucometer.  Dr Lemmie Evens

## 2020-05-28 NOTE — Telephone Encounter (Signed)
Holly Casino, MD  Holly Levy, RN I reviewed her CT angio images - no significant obstruction. I would wait to see if we can improve her diabetes and lipids to see if her symptoms improve.   Dr H  __________  Patient aware of results She will go ahead and start on metformin as MD prescribed - was waiting til after CT test She asked if she needs home glucose monitor - will defer to MD

## 2020-05-28 NOTE — Telephone Encounter (Signed)
Blood glucose meter kit and supplies phoned in to Holly Higgins.  Patient aware of new order

## 2020-06-24 ENCOUNTER — Telehealth: Payer: Self-pay | Admitting: Internal Medicine

## 2020-06-24 NOTE — Telephone Encounter (Signed)
Patient is calling to make Dr. Debara Pickett aware that she has been having issues with her eyes. She would like to ensure that Evolocumab (REPATHA SURECLICK) 102 MG/ML SOAJ and metFORMIN (GLUCOPHAGE XR) 500 MG 24 hr tablet will not cause additional issue. She would like a call back to further discuss.

## 2020-06-24 NOTE — Telephone Encounter (Signed)
Returned call to patient she stated her short range vision has changed.Stated she saw her eye Dr.and he could not find anything. She wanted to make sure it is not caused by Repatha or Metformin.Advised I will send message to our pharmacist for advice.

## 2020-06-24 NOTE — Telephone Encounter (Signed)
Returned call to patient pharmacist's advice given.

## 2020-06-24 NOTE — Telephone Encounter (Signed)
It is possible that metformin could cause vision changes if her blood sugar level is reducing, especially since she has only been on the medication for a little over the month.  This can resolve as her blood sugar normalizes.  However, if it doesn't, recommend repeat opthalmology appointment.

## 2020-07-24 ENCOUNTER — Other Ambulatory Visit: Payer: Self-pay

## 2020-07-24 DIAGNOSIS — E119 Type 2 diabetes mellitus without complications: Secondary | ICD-10-CM

## 2020-07-24 MED ORDER — BLOOD GLUCOSE MONITOR KIT: PACK | 0 refills | Status: AC

## 2020-09-02 ENCOUNTER — Ambulatory Visit: Payer: Commercial Managed Care - PPO | Admitting: Internal Medicine

## 2020-09-19 LAB — LIPID PANEL
Chol/HDL Ratio: 3.6 ratio (ref 0.0–4.4)
Cholesterol, Total: 193 mg/dL (ref 100–199)
HDL: 53 mg/dL (ref >39)
LDL Chol Calc (NIH): 102 mg/dL — ABNORMAL HIGH (ref 0–99)
Triglycerides: 226 mg/dL — ABNORMAL HIGH (ref 0–149)
VLDL Cholesterol Cal: 38 mg/dL (ref 5–40)

## 2020-09-25 ENCOUNTER — Telehealth (INDEPENDENT_AMBULATORY_CARE_PROVIDER_SITE_OTHER): Payer: Commercial Managed Care - PPO | Admitting: Internal Medicine

## 2020-09-25 ENCOUNTER — Encounter: Payer: Self-pay | Admitting: Internal Medicine

## 2020-09-25 DIAGNOSIS — E119 Type 2 diabetes mellitus without complications: Secondary | ICD-10-CM | POA: Diagnosis not present

## 2020-09-25 DIAGNOSIS — I10 Essential (primary) hypertension: Secondary | ICD-10-CM

## 2020-09-25 DIAGNOSIS — E7849 Other hyperlipidemia: Secondary | ICD-10-CM

## 2020-09-25 DIAGNOSIS — R931 Abnormal findings on diagnostic imaging of heart and coronary circulation: Secondary | ICD-10-CM | POA: Diagnosis not present

## 2020-09-25 NOTE — Progress Notes (Signed)
Virtual Visit via Telephone Note   This visit type was conducted due to national recommendations for restrictions regarding the COVID-19 Pandemic (e.g. social distancing) in an effort to limit this patient's exposure and mitigate transmission in our community.  Due to her co-morbid illnesses, this patient is at least at moderate risk for complications without adequate follow up.  This format is felt to be most appropriate for this patient at this time.  The patient did not have access to video technology/had technical difficulties with video requiring transitioning to audio format only (telephone).  All issues noted in this document were discussed and addressed.  No physical exam could be performed with this format.  Please refer to the patient's chart for her  consent to telehealth for [facility].   Date:  09/25/2020   ID:  Holly Higgins, DOB 01-20-1961, MRN [id number] The patient was identified using 2 identifiers.  Evaluation Performed:  Follow-Up Visit  Patient Location:  [address] [place] [address]  Provider location:   [address] [place], [place] [address]  PCP:  Glenda Chroman, MD  Cardiologist:  No primary care provider on file. Electrophysiologist:  None   Chief Complaint:  Follow-up  History of Present Illness:    Holly Higgins is a 59 y.o. female who presents via audio/video conferencing for a telehealth visit today.  This is a pleasant 59 year old female kindly referred by Dr. Gerarda Fraction for evaluation of high cholesterol who also has had progressive shortness of breath, chest pain and fatigue for the past 6 months.  She has a strong family history of coronary disease in her father and grandfather.  She reports longstanding high cholesterol but unfortunately has been intolerant to statins causing myalgias and joint pain.  She is currently on no therapy.  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  Hemoglobin A1c was also noted to be 7.4 although she carries no diagnosis of diabetes this would indicate a new diagnosis.  She did have an episode of elevated blood glucose which was treated by her husband who is also diabetic and paramedic as he provided her with some of his insulin to lower the sugar.  I did strongly advise against this.  [name] reports that she has had some progressive dyspnea and fatigue over the past 95month with decreased energy and exercise tolerance, concerning for possible coronary disease.  Diet is not ideal and could be improved with decreases in sugars and saturated fats.  In addition weight loss would be helpful.  09/25/2020  [name] returns today for follow-up.  He underwent CT coronary angiography which demonstrated a low calcium score of 11, 77th percentile for age and sex matched control.  There was minimal atherosclerosis, CAD RADS 1.  Since the study she reports no further chest pain symptoms.  She has however had significant improvement in her lipids now with total cholesterol 193, triglycerides 226, HDL 53 and LDL 102 (down from an LDL of 197, total cholesterol of 307 and triglycerides 315.  This is in the setting of therapy on Repatha.  The patient does not have symptoms concerning for COVID-19 infection (fever, chills, cough, or new SHORTNESS OF BREATH).    Prior CV studies:   The following studies were reviewed today:  Chart reviewed  PMHx:  Past Medical History:  Diagnosis Date  . Anemia   .  GERD (gastroesophageal reflux disease)   . Hypercholesterolemia   . Hypertension     Past Surgical History:  Procedure Laterality Date  . BLADDER SUSPENSION    . CHOLECYSTECTOMY     remote past  . COLONOSCOPY N/A 08/28/2013   Procedure: COLONOSCOPY;  Surgeon: Danie Binder, MD;  [facility];  Service: Endoscopy;  Laterality: N/A;  9:00  . ENDOMETRIAL ABLATION      FAMHx:   Family History  Problem Relation Age of Onset  . Colon cancer Neg Hx     SOCHx:   reports that she has never smoked. She does not have any smokeless tobacco history on file. She reports that she does not drink alcohol and does not use drugs.  ALLERGIES:  Allergies  Allergen Reactions  . Erythromycin Other (See Comments)    Chest Pain   . Latex Itching and Swelling  . Morphine And Related Nausea And Vomiting  . Statins     Muscle aches, joint pain  . Banana Nausea And Vomiting, Swelling and Rash  . Penicillins Swelling and Rash    MEDS:  Current Meds  Medication Sig  . blood glucose meter kit and supplies KIT Dispense based on patient and insurance preference. Use up to four times daily as directed. (FOR ICD-9 250.00, 250.01).  . cetirizine (ZYRTEC) 10 MG tablet Take 10 mg by mouth daily.  . Evolocumab (REPATHA SURECLICK) 498 MG/ML SOAJ Inject 1 Dose into the skin every 14 (fourteen) days.  . hydrochlorothiazide (HYDRODIURIL) 25 MG tablet Take 25 mg by mouth daily.  . metFORMIN (GLUCOPHAGE XR) 500 MG 24 hr tablet Take 1 tablet (500 mg total) by mouth in the morning and at bedtime. (Patient taking differently: Take 500-750 mg by mouth daily with breakfast.)  . montelukast (SINGULAIR) 10 MG tablet Take 10 mg by mouth daily.  [place] Kitchen omeprazole (PRILOSEC OTC) 20 MG tablet Take 20 mg by mouth daily.  . potassium chloride SA (K-DUR,KLOR-CON) 20 MEQ tablet Take 20 mEq by mouth daily.  [place] Kitchen VITAMIN D, CHOLECALCIFEROL, PO Take 1 tablet by mouth daily.     ROS: Pertinent items noted in HPI and remainder of comprehensive ROS otherwise negative.  Labs/Other Tests and Data Reviewed:    Recent Labs: 05/15/2020: BUN 11; Creatinine, Ser 0.86; Potassium 3.5; Sodium 138   Recent Lipid Panel Lab Results  Component Value Date/Time   CHOL 193 09/18/2020 08:57 AM   TRIG 226 (H) 09/18/2020 08:57 AM   HDL 53 09/18/2020 08:57 AM   CHOLHDL 3.6 09/18/2020 08:57 AM   LDLCALC 102 (H) 09/18/2020 08:57  AM    Wt Readings from Last 3 Encounters:  04/29/20 194 lb (88 kg)  03/28/15 193 lb (87.5 kg)  08/28/13 188 lb (85.3 kg)     Exam:    Vital Signs:  There were no vitals taken for this visit.   Exam not performed due to telephone visit  ASSESSMENT & PLAN:    1. 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 Progressive fatigue, exercise intolerance and chest discomfort -low risk CT coronary angiogram with calcium score of 11, 77th percentile (05/2020) 3. New onset diabetes-hemoglobin A1c 7.4 4. Essential hypertension 5. Moderate obesity 6. Strong family history of coronary disease 7. Statin intolerance-myalgias  Ms. Marsland the lower risk CT coronary angiogram -she has had excellent response to Repatha with marked reduction in her lipids and LDL now just around 100.  Overall I think she is doing well on her current medications.  To need to work on diet, exercise and weight management and will recommend follow-up in 6 months with repeat lipids.  COVID-19 Education: The signs and symptoms of COVID-19 were discussed with the patient and how to seek care for testing (follow up with PCP or arrange E-visit).  The importance of social distancing was discussed today.  Patient Risk:   After full review of this patients clinical status, I feel that they are at least moderate risk at this time.  Time:   Today, I have spent 15 minutes with the patient with telehealth technology discussing dyslipidemia, coronary CT results.     Medication Adjustments/Labs and Tests Ordered: Current medicines are reviewed at length with the patient today.  Concerns regarding medicines are outlined above.   Tests Ordered: Orders Placed This Encounter  Procedures  . Hemoglobin A1c  . Comprehensive metabolic panel  . Lipid panel    Medication Changes: No orders of the defined types were placed in this encounter.   Disposition:  in 6 month(s)  Pixie Casino, MD, [facility], [place] Director of the Advanced Lipid Disorders &  Cardiovascular Risk Reduction Clinic Diplomate of the American Board of Clinical Lipidology Attending Cardiologist  Direct Dial: [phone]  Fax: [phone]  Website:  [email or web address]  Pixie Casino, MD  09/25/2020 8:09 PM

## 2020-09-25 NOTE — Patient Instructions (Signed)
Medication Instructions:  Your physician recommends that you continue on your current medications as directed. Please refer to the Current Medication list given to you today.  *If you need a refill on your cardiac medications before your next appointment, please call your pharmacy*   Lab Work: A1c & CMET to be completed within the next few weeks at [place] lipid panel in 6 months (due before your next visit) If you have labs (blood work) drawn today and your tests are completely normal, you will receive your results only by: [place] MyChart Message (if you have MyChart) OR . A paper copy in the mail If you have any lab test that is abnormal or we need to change your treatment, we will call you to review the results.   Follow-Up: At [facility], you and your health needs are our priority.  As part of our continuing mission to provide you with exceptional heart care, we have created designated Provider Care Teams.  These Care Teams include your primary Cardiologist (physician) and Advanced Practice Providers (APPs -  Physician Assistants and Nurse Practitioners) who all work together to provide you with the care you need, when you need it.  We recommend signing up for the patient portal called "MyChart".  Sign up information is provided on this After Visit Summary.  MyChart is used to connect with patients for Virtual Visits (Telemedicine).  Patients are able to view lab/test results, encounter notes, upcoming appointments, etc.  Non-urgent messages can be sent to your provider as well.   To learn more about what you can do with MyChart, go to [email or web address].    Your next appointment:   6 month(s)  The format for your next appointment:   In Person  Provider:    Dr. Debara Pickett - lipid clinic    Other Instructions Check BP at home - call with about 2 weeks of readings

## 2020-10-06 ENCOUNTER — Telehealth: Payer: Self-pay | Admitting: Internal Medicine

## 2020-10-06 NOTE — Telephone Encounter (Signed)
PA for repatha sureclick submitted via CMM (Key: [id number])

## 2020-10-06 NOTE — Telephone Encounter (Signed)
Request Reference Number: [id number]. REPATHA SURE INJ 140MG /ML is approved through 10/06/2021

## 2021-02-09 ENCOUNTER — Other Ambulatory Visit: Payer: Self-pay | Admitting: Obstetrics and Gynecology

## 2021-02-09 DIAGNOSIS — N951 Menopausal and female climacteric states: Secondary | ICD-10-CM

## 2021-02-09 DIAGNOSIS — E2839 Other primary ovarian failure: Secondary | ICD-10-CM

## 2021-03-11 ENCOUNTER — Other Ambulatory Visit: Payer: Self-pay

## 2021-03-11 ENCOUNTER — Ambulatory Visit
Admission: RE | Admit: 2021-03-11 | Discharge: 2021-03-11 | Disposition: A | Discharge status: Home / Self Care | Payer: Commercial Managed Care - PPO | Source: Ambulatory Visit | Attending: Obstetrics and Gynecology | Admitting: Obstetrics and Gynecology

## 2021-03-11 DIAGNOSIS — N951 Menopausal and female climacteric states: Secondary | ICD-10-CM

## 2021-03-11 DIAGNOSIS — E2839 Other primary ovarian failure: Secondary | ICD-10-CM

## 2021-04-16 ENCOUNTER — Other Ambulatory Visit: Payer: Self-pay | Admitting: Internal Medicine

## 2021-05-30 LAB — COMPREHENSIVE METABOLIC PANEL
ALT: 24 IU/L (ref 0–32)
AST: 20 IU/L (ref 0–40)
Albumin/Globulin Ratio: 1.2 (ref 1.2–2.2)
Albumin: 4.2 g/dL (ref 3.8–4.9)
Alkaline Phosphatase: 85 IU/L (ref 44–121)
BUN/Creatinine Ratio: 14 (ref 9–23)
BUN: 12 mg/dL (ref 6–24)
Bilirubin Total: 0.4 mg/dL (ref 0.0–1.2)
CO2: 29 mmol/L (ref 20–29)
Calcium: 9.4 mg/dL (ref 8.7–10.2)
Chloride: 95 mmol/L — ABNORMAL LOW (ref 96–106)
Creatinine, Ser: 0.88 mg/dL (ref 0.57–1.00)
Globulin, Total: 3.4 g/dL (ref 1.5–4.5)
Glucose: 169 mg/dL — ABNORMAL HIGH (ref 65–99)
Potassium: 3.5 mmol/L (ref 3.5–5.2)
Sodium: 140 mmol/L (ref 134–144)
Total Protein: 7.6 g/dL (ref 6.0–8.5)
eGFR: 76 mL/min/{1.73_m2} (ref >59)

## 2021-05-30 LAB — LIPID PANEL
Chol/HDL Ratio: 5.5 ratio — ABNORMAL HIGH (ref 0.0–4.4)
Cholesterol, Total: 238 mg/dL — ABNORMAL HIGH (ref 100–199)
HDL: 43 mg/dL (ref >39)
LDL Chol Calc (NIH): 113 mg/dL — ABNORMAL HIGH (ref 0–99)
Triglycerides: 474 mg/dL — ABNORMAL HIGH (ref 0–149)
VLDL Cholesterol Cal: 82 mg/dL — ABNORMAL HIGH (ref 5–40)

## 2021-05-30 LAB — HEMOGLOBIN A1C
Est. average glucose Bld gHb Est-mCnc: 171 mg/dL
Hgb A1c MFr Bld: 7.6 % — ABNORMAL HIGH (ref 4.8–5.6)

## 2021-06-05 ENCOUNTER — Ambulatory Visit (INDEPENDENT_AMBULATORY_CARE_PROVIDER_SITE_OTHER): Payer: Commercial Managed Care - PPO | Admitting: Internal Medicine

## 2021-06-05 ENCOUNTER — Other Ambulatory Visit: Payer: Self-pay

## 2021-06-05 VITALS — BP 142/86 | HR 79 | Ht 63.5 in | Wt 193.6 lb

## 2021-06-05 DIAGNOSIS — E7849 Other hyperlipidemia: Secondary | ICD-10-CM | POA: Diagnosis not present

## 2021-06-05 DIAGNOSIS — R931 Abnormal findings on diagnostic imaging of heart and coronary circulation: Secondary | ICD-10-CM

## 2021-06-05 DIAGNOSIS — E119 Type 2 diabetes mellitus without complications: Secondary | ICD-10-CM | POA: Diagnosis not present

## 2021-06-05 DIAGNOSIS — Z79899 Other long term (current) drug therapy: Secondary | ICD-10-CM

## 2021-06-05 NOTE — Patient Instructions (Signed)
Medication Instructions:  Your Physician recommend you continue on your current medication as directed.    *If you need a refill on your cardiac medications before your next appointment, please call your pharmacy*   Lab Work: Your physician recommends that you return for lab work in 3 months (fasting lipid, direct LDL).  If you have labs (blood work) drawn today and your tests are completely normal, you will receive your results only by: [place] (if you have MyChart) OR A paper copy in the mail If you have any lab test that is abnormal or we need to change your treatment, we will call you to review the results.   Testing/Procedures: None ordered today   Follow-Up: At [facility], you and your health needs are our priority.  As part of our continuing mission to provide you with exceptional heart care, we have created designated Provider Care Teams.  These Care Teams include your primary Cardiologist (physician) and Advanced Practice Providers (APPs -  Physician Assistants and Nurse Practitioners) who all work together to provide you with the care you need, when you need it.  We recommend signing up for the patient portal called "MyChart".  Sign up information is provided on this After Visit Summary.  MyChart is used to connect with patients for Virtual Visits (Telemedicine).  Patients are able to view lab/test results, encounter notes, upcoming appointments, etc.  Non-urgent messages can be sent to your provider as well.   To learn more about what you can do with MyChart, go to [email or web address].    Your next appointment:   3 month(s)  The format for your next appointment:   In Person or virtual   Provider:   Raliegh Ip Mali Hilty, MD

## 2021-06-05 NOTE — Progress Notes (Signed)
LIPID CLINIC CONSULT NOTE  Chief Complaint:  Follow-up high cholesterol  Primary Care Physician: Glenda Chroman, MD  Primary Cardiologist:  None  HPI:  Holly Higgins is a 60 y.o. female who is being seen today for the evaluation of high cholesterol, fatigue, shortness of breath at the request of Vyas, Dhruv B, MD.  This is a pleasant 60 year old female kindly referred by Dr. Gerarda Fraction for evaluation of high cholesterol who also has had progressive shortness of breath, chest pain and fatigue for the past 6 months.  She has a strong family history of coronary disease in her father and grandfather.  She reports longstanding high cholesterol but unfortunately has been intolerant to statins causing myalgias and joint pain.  She is currently on no therapy.  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  Hemoglobin A1c was also noted to be 7.4 although she carries no diagnosis of diabetes this would indicate a new diagnosis.  She did have an episode of elevated blood glucose which was treated by her husband who is also diabetic and paramedic as he provided her with some of his insulin to lower the sugar.  I did strongly advise against this.  Holly Higgins reports that she has had some progressive dyspnea and fatigue over the past 4month with decreased energy and exercise tolerance, concerning for possible coronary disease.  Diet is not ideal and could be improved with decreases in sugars and saturated fats.  In addition weight loss would be helpful.  06/05/2021  Holly Higgins returns today for follow-up.  She continues on Repatha and reports no significant issues with the medication.  Recently she had come off of metformin but restarted it after having some vision problems.  Hemoglobin A1c is up slightly to 7.6 from 7.4.  Cholesterol however has gone up.  Total now 238, HDL 43,  triglycerides 474 and LDL 113, up from 102.  The triglycerides are probably primarily diet related.  She says her diet has been pretty much horrible and her activity level has been near 0 after injuring her back recently.  She is working with a cRestaurant manager, fast foodon that.  PMHx:  Past Medical History:  Diagnosis Date   Anemia    GERD (gastroesophageal reflux disease)    Hypercholesterolemia    Hypertension     Past Surgical History:  Procedure Laterality Date   BLADDER SUSPENSION     CHOLECYSTECTOMY     remote past   COLONOSCOPY N/A 08/28/2013   Procedure: COLONOSCOPY;  Surgeon: SDanie Binder MD;  [facility];  Service: Endoscopy;  Laterality: N/A;  9:00   ENDOMETRIAL ABLATION      FAMHx:  Family History  Problem Relation Age of Onset   Colon cancer Neg Hx     SOCHx:   reports that she does not drink alcohol and does not use drugs. No history on file for tobacco use.  ALLERGIES:  Allergies  Allergen Reactions   Erythromycin Other (See Comments)    Chest Pain    Latex Itching and Swelling   Morphine And Related Nausea And Vomiting   Statins     Muscle aches, joint pain   Banana Nausea And Vomiting, Swelling and Rash   Penicillins Swelling and Rash    ROS: Pertinent items noted in HPI and remainder of comprehensive ROS otherwise negative.  HOME MEDS: Current Outpatient Medications  on File Prior to Visit  Medication Sig Dispense Refill   amLODipine (NORVASC) 10 MG tablet Take 10 mg by mouth daily.     blood glucose meter kit and supplies KIT Dispense based on patient and insurance preference. Use up to four times daily as directed. (FOR ICD-9 250.00, 250.01). 1 each 0   cetirizine (ZYRTEC) 10 MG tablet Take 10 mg by mouth daily.     Evolocumab (REPATHA SURECLICK) 400 MG/ML SOAJ INJECT 1 DOSE INTO SKIN EVERY 14 DAYS 6 mL 3   hydrochlorothiazide (HYDRODIURIL) 25 MG tablet Take 25 mg by mouth daily.     metFORMIN (GLUCOPHAGE XR) 500 MG 24 hr tablet Take 1  tablet (500 mg total) by mouth in the morning and at bedtime. 60 tablet 11   montelukast (SINGULAIR) 10 MG tablet Take 10 mg by mouth daily.     omeprazole (PRILOSEC OTC) 20 MG tablet Take 20 mg by mouth daily.     potassium chloride SA (K-DUR,KLOR-CON) 20 MEQ tablet Take 20 mEq by mouth daily.     VITAMIN D, CHOLECALCIFEROL, PO Take 1 tablet by mouth daily.     No current facility-administered medications on file prior to visit.    LABS/IMAGING: No results found for this or any previous visit (from the past 48 hour(s)). No results found.  LIPID PANEL:    Component Value Date/Time   CHOL 238 (H) 05/29/2021 0929   TRIG 474 (H) 05/29/2021 0929   HDL 43 05/29/2021 0929   CHOLHDL 5.5 (H) 05/29/2021 0929   LDLCALC 113 (H) 05/29/2021 0929    WEIGHTS: Wt Readings from Last 3 Encounters:  06/05/21 193 lb 9.6 oz (87.8 kg)  04/29/20 194 lb (88 kg)  03/28/15 193 lb (87.5 kg)    VITALS: BP (!) 142/86   Pulse 79   Ht 5' 3.5" (1.613 m)   Wt 193 lb 9.6 oz (87.8 kg)   SpO2 97%   BMI 33.76 kg/m   EXAM: General appearance: alert, no distress and moderately obese Neck: no carotid bruit, no JVD and thyroid not enlarged, symmetric, no tenderness/mass/nodules Lungs: clear to auscultation bilaterally Heart: regular rate and rhythm, S1, S2 normal, no murmur, click, rub or gallop Abdomen: soft, non-tender; bowel sounds normal; no masses,  no organomegaly and obese Extremities: extremities normal, atraumatic, no cyanosis or edema and Significant lipedema Pulses: 2+ and symmetric Skin: Skin color, texture, turgor normal. No rashes or lesions Neurologic: Grossly normal Psych: Pleasant  EKG: Deferred  ASSESSMENT: Significant dyslipidemia-possible familial hyperlipidemia or familial combined hyperlipidemia Progressive fatigue, exercise intolerance and chest discomfort New onset diabetes-hemoglobin A1c 7.4 Essential hypertension Moderate obesity Strong family history of coronary  disease Statin intolerance-myalgias  PLAN: 1.   Ms. Dansby has had recent increase in her triglycerides.  This is primary dietarily related.  She needs to continue to watch carbohydrates but also saturated fats and needs more activity and exercise.  She had injured her back but she says is improving after working with a Restaurant manager, fast food.  We will plan repeat lipids in about 3 months.  If her numbers do not improve, we may need to add additional therapy such as a fibrate or omega-3.  Pixie Casino, MD, [facility], [place] Director of the Advanced Lipid Disorders &  Cardiovascular Risk Reduction Clinic Diplomate of the American Board of Clinical Lipidology Attending Cardiologist  Direct Dial: [phone]  Fax: [phone]  Website:  [email or web address] Alves 06/05/2021, 10:04 AM

## 2021-08-31 ENCOUNTER — Telehealth: Payer: Self-pay | Admitting: Internal Medicine

## 2021-08-31 NOTE — Telephone Encounter (Signed)
PA for Repatha faxed to [facility] @ [phone] per form received

## 2021-09-03 LAB — LIPID PANEL
Chol/HDL Ratio: 3.2 ratio (ref 0.0–4.4)
Cholesterol, Total: 146 mg/dL (ref 100–199)
HDL: 45 mg/dL (ref >39)
LDL Chol Calc (NIH): 68 mg/dL (ref 0–99)
Triglycerides: 196 mg/dL — ABNORMAL HIGH (ref 0–149)
VLDL Cholesterol Cal: 33 mg/dL (ref 5–40)

## 2021-09-03 LAB — LDL CHOLESTEROL, DIRECT: LDL Direct: 69 mg/dL (ref 0–99)

## 2021-09-07 ENCOUNTER — Telehealth (INDEPENDENT_AMBULATORY_CARE_PROVIDER_SITE_OTHER): Payer: Commercial Managed Care - PPO | Admitting: Internal Medicine

## 2021-09-07 ENCOUNTER — Encounter: Payer: Self-pay | Admitting: Internal Medicine

## 2021-09-07 DIAGNOSIS — R931 Abnormal findings on diagnostic imaging of heart and coronary circulation: Secondary | ICD-10-CM | POA: Diagnosis not present

## 2021-09-07 DIAGNOSIS — I10 Essential (primary) hypertension: Secondary | ICD-10-CM | POA: Diagnosis not present

## 2021-09-07 DIAGNOSIS — E119 Type 2 diabetes mellitus without complications: Secondary | ICD-10-CM | POA: Diagnosis not present

## 2021-09-07 DIAGNOSIS — E7849 Other hyperlipidemia: Secondary | ICD-10-CM

## 2021-09-07 NOTE — Progress Notes (Signed)
Virtual Visit via Video Note   This visit type was conducted due to national recommendations for restrictions regarding the COVID-19 Pandemic (e.g. social distancing) in an effort to limit this patient's exposure and mitigate transmission in our community.  Due to her co-morbid illnesses, this patient is at least at moderate risk for complications without adequate follow up.  This format is felt to be most appropriate for this patient at this time.  The patient does have access to video technolog.  All issues noted in this document were discussed and addressed.  A limited physical exam could be performed with this format.  Please refer to the patient's chart for her  consent to telehealth for [facility].   Date:  09/07/2021   ID:  Holly Higgins, DOB 1961/02/22, MRN [id number] The patient was identified using 2 identifiers.  Evaluation Performed:  Follow-Up Visit  Patient Location:  [address] [place] [address]  Provider location:   [address] [place], [place] [address]  PCP:  Glenda Chroman, MD  Cardiologist:  None Electrophysiologist:  None   Chief Complaint:  Video follow-up  History of Present Illness:    Holly Higgins is a 60 y.o. female who presents via audio/video conferencing for a telehealth visit today.  This is a pleasant 59 year old female kindly referred by Dr. Gerarda Fraction for evaluation of high cholesterol who also has had progressive shortness of breath, chest pain and fatigue for the past 6 months.  She has a strong family history of coronary disease in her father and grandfather.  She reports longstanding high cholesterol but unfortunately has been intolerant to statins causing myalgias and joint pain.  She is currently on no therapy.  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  Hemoglobin A1c was also noted to be 7.4  although she carries no diagnosis of diabetes this would indicate a new diagnosis.  She did have an episode of elevated blood glucose which was treated by her husband who is also diabetic and paramedic as he provided her with some of his insulin to lower the sugar.  I did strongly advise against this.  Holly Higgins reports that she has had some progressive dyspnea and fatigue over the past 42month with decreased energy and exercise tolerance, concerning for possible coronary disease.  Diet is not ideal and could be improved with decreases in sugars and saturated fats.  In addition weight loss would be helpful.  09/25/2020  Holly Higgins returns today for follow-up.  He underwent CT coronary angiography which demonstrated a low calcium score of 11, 77th percentile for age and sex matched control.  There was minimal atherosclerosis, CAD RADS 1.  Since the study she reports no further chest pain symptoms.  She has however had significant improvement in her lipids now with total cholesterol 193, triglycerides 226, HDL 53 and LDL 102 (down from an LDL of 197, total cholesterol of 307 and triglycerides 315.  This is in the setting of therapy on Repatha.  09/07/2021  Holly Higgins returns today for video follow-up.  He has responded quite well to RHerbst  Total cholesterol is down to 146 (from 238), triglycerides 196 down from 474, HDL has increased from 43-45 and LDL cholesterol is now 68, down from 113.  Overall she is tolerating the medication well.  She was quite impressed with the degree of lipid-lowering she  has.  The patient does not have symptoms concerning for COVID-19 infection (fever, chills, cough, or new SHORTNESS OF BREATH).    Prior CV studies:   The following studies were reviewed today:  Chart reviewed, Labwork  PMHx:  Past Medical History:  Diagnosis Date   Anemia    GERD (gastroesophageal reflux disease)    Hypercholesterolemia    Hypertension     Past Surgical History:  Procedure Laterality  Date   BLADDER SUSPENSION     CHOLECYSTECTOMY     remote past   COLONOSCOPY N/A 08/28/2013   Procedure: COLONOSCOPY;  Surgeon: Danie Binder, MD;  [facility];  Service: Endoscopy;  Laterality: N/A;  9:00   ENDOMETRIAL ABLATION      FAMHx:  Family History  Problem Relation Age of Onset   Colon cancer Neg Hx     SOCHx:   reports that she has never smoked. She does not have any smokeless tobacco history on file. She reports that she does not drink alcohol and does not use drugs.  ALLERGIES:  Allergies  Allergen Reactions   Erythromycin Other (See Comments)    Chest Pain    Latex Itching and Swelling   Morphine And Related Nausea And Vomiting   Statins     Muscle aches, joint pain   Banana Nausea And Vomiting, Swelling and Rash   Penicillins Swelling and Rash    MEDS:  Current Meds  Medication Sig   amLODipine (NORVASC) 10 MG tablet Take 10 mg by mouth daily.   blood glucose meter kit and supplies KIT Dispense based on patient and insurance preference. Use up to four times daily as directed. (FOR ICD-9 250.00, 250.01).   cetirizine (ZYRTEC) 10 MG tablet Take 10 mg by mouth daily.   Evolocumab (REPATHA SURECLICK) 751 MG/ML SOAJ INJECT 1 DOSE INTO SKIN EVERY 14 DAYS   glipiZIDE (GLUCOTROL XL) 5 MG 24 hr tablet Take 5 mg by mouth daily.   hydrochlorothiazide (HYDRODIURIL) 25 MG tablet Take 25 mg by mouth daily.   metFORMIN (GLUCOPHAGE XR) 500 MG 24 hr tablet Take 1 tablet (500 mg total) by mouth in the morning and at bedtime.   montelukast (SINGULAIR) 10 MG tablet Take 10 mg by mouth daily.   omeprazole (PRILOSEC OTC) 20 MG tablet Take 20 mg by mouth daily.   potassium chloride SA (K-DUR,KLOR-CON) 20 MEQ tablet Take 20 mEq by mouth daily.   VITAMIN D, CHOLECALCIFEROL, PO Take 1 tablet by mouth daily.     ROS: Pertinent items noted in HPI and remainder of comprehensive ROS otherwise negative.  Labs/Other Tests and Data Reviewed:    Recent Labs: 05/29/2021:  ALT 24; BUN 12; Creatinine, Ser 0.88; Potassium 3.5; Sodium 140   Recent Lipid Panel Lab Results  Component Value Date/Time   CHOL 146 09/02/2021 09:32 AM   TRIG 196 (H) 09/02/2021 09:32 AM   HDL 45 09/02/2021 09:32 AM   CHOLHDL 3.2 09/02/2021 09:32 AM   LDLCALC 68 09/02/2021 09:32 AM   LDLDIRECT 69 09/02/2021 09:32 AM    Wt Readings from Last 3 Encounters:  06/05/21 193 lb 9.6 oz (87.8 kg)  04/29/20 194 lb (88 kg)  03/28/15 193 lb (87.5 kg)     Exam:    Vital Signs:  There were no vitals taken for this visit.   General appearance: alert and no distress Lungs: No visual respiratory difficulty Abdomen: Normal weight Extremities: extremities normal, atraumatic, no cyanosis or edema Skin: Skin color, texture, turgor normal.  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  Her total cholesterol now 146 with LDL at 68.  Would recommend she continue with this current dosing.  She should continue with her dietary and exercise activities as well.  We will plan follow-up annually or sooner as necessary.  COVID-19 Education: The signs and symptoms of COVID-19 were discussed with the patient and how to seek care for testing (follow up with PCP or arrange E-visit).  The importance of social distancing was discussed today.  Patient Risk:   After full review of this patients clinical status, I feel that they are at least moderate risk at this time.  Time:    Today, I have spent 15 minutes with the patient with telehealth technology discussing dyslipidemia, coronary CT results.     Medication Adjustments/Labs and Tests Ordered: Current medicines are reviewed at length with the patient today.  Concerns regarding medicines are outlined above.   Tests Ordered: Orders Placed This Encounter  Procedures   LDL cholesterol, direct   Lipid panel     Medication Changes: No orders of the defined types were placed in this encounter.   Disposition:  in 1 year(s)  Pixie Casino, MD, [facility], [place] Director of the Advanced Lipid Disorders &  Cardiovascular Risk Reduction Clinic Diplomate of the American Board of Clinical Lipidology Attending Cardiologist  Direct Dial: [phone]  Fax: [phone]  Website:  [email or web address]  Pixie Casino, MD  09/07/2021 10:31 AM

## 2021-09-07 NOTE — Patient Instructions (Addendum)
Medication Instructions:  Your physician recommends that you continue on your current medications as directed. Please refer to the Current Medication list given to you today.  *If you need a refill on your cardiac medications before your next appointment, please call your pharmacy*   Lab Work: FASTING lab work to check cholesterol in 1 year  -- complete about 1 week before your next visit with Dr. Debara Pickett   If you have labs (blood work) drawn today and your tests are completely normal, you will receive your results only by: [place] (if you have MyChart) OR A paper copy in the mail If you have any lab test that is abnormal or we need to change your treatment, we will call you to review the results.   Testing/Procedures: NONE   Follow-Up: At [facility], you and your health needs are our priority.  As part of our continuing mission to provide you with exceptional heart care, we have created designated Provider Care Teams.  These Care Teams include your primary Cardiologist (physician) and Advanced Practice Providers (APPs -  Physician Assistants and Nurse Practitioners) who all work together to provide you with the care you need, when you need it.  We recommend signing up for the patient portal called "MyChart".  Sign up information is provided on this After Visit Summary.  MyChart is used to connect with patients for Virtual Visits (Telemedicine).  Patients are able to view lab/test results, encounter notes, upcoming appointments, etc.  Non-urgent messages can be sent to your provider as well.   To learn more about what you can do with MyChart, go to [email or web address].    Your next appointment:   12 month(s)  The format for your next appointment:   In Person -- [place] or [place]) Office  Provider:   Dr. Debara Pickett

## 2021-09-09 ENCOUNTER — Telehealth: Payer: Self-pay | Admitting: Internal Medicine

## 2021-09-09 DIAGNOSIS — E7849 Other hyperlipidemia: Secondary | ICD-10-CM

## 2021-09-09 NOTE — Telephone Encounter (Addendum)
Previously submitted PA via fax was unsuccessful in going thru  [employer] @ [phone] and did PA over the phone  Case ID: [id number]  Response by 09/24/21  Faxed chart notes to [phone]

## 2021-09-16 ENCOUNTER — Encounter: Payer: Self-pay | Admitting: *Deleted

## 2021-09-16 NOTE — Telephone Encounter (Signed)
PA denied for not having CAC score >300 Appeal letter composed, faxed to [phone] - [employer] clinical appeals dept

## 2021-10-07 NOTE — Telephone Encounter (Signed)
Patient denied level 1 appeals -- letters states coverage is provided in situations where patient has tired one high-intensity statin (atorvastatin 40mg  or greater, rosuvastatin 20mg  or greater) for at least 8 continuous weeks and had LDL after this of greater than 70mg /dL OR patient has been determined to be statin intolerant by experiencing statin-related rhabdomyolysis or skeletal-related muscle symptoms that occurred w/separate trials of atorvastatin and rosuvastatin.   Spoke with patient. She was previously approved for Repatha -- states she had a different insurance at that time. She has ONLY tried/failed pravastatin(?) w/myalgias, flu-like symptoms.

## 2021-10-07 NOTE — Telephone Encounter (Signed)
Faxed to [place] [facility] a request for level 2 appeals with additional info on form provided

## 2021-11-25 NOTE — Telephone Encounter (Signed)
Member ID: [id number]  Called insurance at [phone] and was on the phone for 40 minutes. Was able to speak with rep about peer-to-peer, of which MD or prescribed can ONLY do and was told there is no direct number to call them back back, and to call the number listed above for MD to complete peer to peer.   Was unable get to speak with any clinical representative to explain the case that this patient has been on Repatha since 04/2020 and has been tolerating medication well and it has been effective in treatment of dyslipidemia.    Will route to MD for assistance

## 2021-11-26 ENCOUNTER — Telehealth: Payer: Self-pay

## 2021-11-26 NOTE — Telephone Encounter (Signed)
Sample: Repatha Sure-Click auto-injectors 270 mg/mL Lot # [id number] Exp: 07/17/2024 Qty: 3 Location: [facility] [place]/medication fridge

## 2021-11-26 NOTE — Telephone Encounter (Signed)
Spoke with patient. She is aware that samples are available for pick up at the [place] office location

## 2021-12-08 NOTE — Telephone Encounter (Signed)
Pixie Casino, MD  You 22 minutes ago (9:33 AM)   Looks like she will have to try and fail another statin before being eligible.  Would repeat lipid since last labs were in November to see what her numbers are off treatment - then can consider retrial of statin.   Dr Lemmie Evens

## 2021-12-11 NOTE — Telephone Encounter (Signed)
Spoke with patient. Aware that insurance will not cover repatha. Will need to come off medication, recheck labs off, and consider a different statin  She had picked up samples previously so will finish those out, complete fasting labs after at least 1 month off medication  Scheduled visit with MD on 04/01/22 for follow up in [place] office location

## 2022-03-13 LAB — LIPID PANEL
Chol/HDL Ratio: 5.3 ratio — ABNORMAL HIGH (ref 0.0–4.4)
Cholesterol, Total: 229 mg/dL — ABNORMAL HIGH (ref 100–199)
HDL: 43 mg/dL (ref >39)
LDL Chol Calc (NIH): 122 mg/dL — ABNORMAL HIGH (ref 0–99)
Triglycerides: 362 mg/dL — ABNORMAL HIGH (ref 0–149)
VLDL Cholesterol Cal: 64 mg/dL — ABNORMAL HIGH (ref 5–40)

## 2022-03-13 LAB — LDL CHOLESTEROL, DIRECT: LDL Direct: 114 mg/dL — ABNORMAL HIGH (ref 0–99)

## 2022-04-01 ENCOUNTER — Ambulatory Visit (INDEPENDENT_AMBULATORY_CARE_PROVIDER_SITE_OTHER): Payer: Commercial Managed Care - PPO | Admitting: Internal Medicine

## 2022-04-01 ENCOUNTER — Encounter: Payer: Self-pay | Admitting: Internal Medicine

## 2022-04-01 VITALS — BP 130/82 | HR 79 | Ht 63.5 in | Wt 196.2 lb

## 2022-04-01 DIAGNOSIS — I10 Essential (primary) hypertension: Secondary | ICD-10-CM

## 2022-04-01 DIAGNOSIS — E7849 Other hyperlipidemia: Secondary | ICD-10-CM

## 2022-04-01 DIAGNOSIS — E119 Type 2 diabetes mellitus without complications: Secondary | ICD-10-CM

## 2022-04-01 DIAGNOSIS — R931 Abnormal findings on diagnostic imaging of heart and coronary circulation: Secondary | ICD-10-CM | POA: Diagnosis not present

## 2022-04-01 MED ORDER — EZETIMIBE 10 MG PO TABS: 10.0000 mg | ORAL_TABLET | Freq: Every day | ORAL | 3 refills | Status: AC

## 2022-04-01 NOTE — Patient Instructions (Signed)
Medication Instructions:  Your physician has recommended you make the following change in your medication:  Start Zetia 10 mg tablets   Labwork: In 3 months:  Fasting Lipid Panel- Do not eat or drink at least 8 hours prior to having labs drawn  Testing/Procedures: None  Follow-Up: Follow up with Dr. Debara Pickett in 3-4 months.   Any Other Special Instructions Will Be Listed Below (If Applicable).     If you need a refill on your cardiac medications before your next appointment, please call your pharmacy.

## 2022-04-01 NOTE — Progress Notes (Unsigned)
LIPID CLINIC CONSULT NOTE  Chief Complaint:  Follow-up high cholesterol  Primary Care Physician: Glenda Chroman, MD  Primary Cardiologist:  None  HPI:  Holly Higgins is a 61 y.o. female who is being seen today for the evaluation of high cholesterol, fatigue, shortness of breath at the request of Vyas, Dhruv B, MD.  This is a pleasant 61 year old female kindly referred by Dr. Gerarda Fraction for evaluation of high cholesterol who also has had progressive shortness of breath, chest pain and fatigue for the past 6 months.  She has a strong family history of coronary disease in her father and grandfather.  She reports longstanding high cholesterol but unfortunately has been intolerant to statins causing myalgias and joint pain.  She is currently on no therapy.  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  Hemoglobin A1c was also noted to be 7.4 although she carries no diagnosis of diabetes this would indicate a new diagnosis.  She did have an episode of elevated blood glucose which was treated by her husband who is also diabetic and paramedic as he provided her with some of his insulin to lower the sugar.  I did strongly advise against this.  Holly Higgins reports that she has had some progressive dyspnea and fatigue over the past 1month with decreased energy and exercise tolerance, concerning for possible coronary disease.  Diet is not ideal and could be improved with decreases in sugars and saturated fats.  In addition weight loss would be helpful.  06/05/2021  Holly Higgins returns today for follow-up.  She continues on Repatha and reports no significant issues with the medication.  Recently she had come off of metformin but restarted it after having some vision problems.  Hemoglobin A1c is up slightly to 7.6 from 7.4.  Cholesterol however has gone up.  Total now 238, HDL 43,  triglycerides 474 and LDL 113, up from 102.  The triglycerides are probably primarily diet related.  She says her diet has been pretty much horrible and her activity level has been near 0 after injuring her back recently.  She is working with a cRestaurant manager, fast foodon that.  PMHx:  Past Medical History:  Diagnosis Date   Anemia    GERD (gastroesophageal reflux disease)    Hypercholesterolemia    Hypertension    Type 2 diabetes mellitus ([place]     Past Surgical History:  Procedure Laterality Date   BLADDER SUSPENSION     CHOLECYSTECTOMY     remote past   COLONOSCOPY N/A 08/28/2013   Procedure: COLONOSCOPY;  Surgeon: SDanie Binder MD;  [facility];  Service: Endoscopy;  Laterality: N/A;  9:00   ENDOMETRIAL ABLATION      FAMHx:  Family History  Problem Relation Age of Onset   Colon cancer Neg Hx     SOCHx:   reports that she has never smoked. She does not have any smokeless tobacco history on file. She reports that she does not drink alcohol and does not use drugs.  ALLERGIES:  Allergies  Allergen Reactions   Erythromycin Other (See Comments)    Chest Pain    Latex Itching and Swelling   Morphine And Related Nausea And Vomiting   Statins     Muscle aches, joint pain   Banana Nausea And Vomiting, Swelling and Rash   Penicillins Swelling and Rash    ROS:  Pertinent items noted in HPI and remainder of comprehensive ROS otherwise negative.  HOME MEDS: Current Outpatient Medications on File Prior to Visit  Medication Sig Dispense Refill   atenolol (TENORMIN) 25 MG tablet Take 25 mg by mouth daily.     blood glucose meter kit and supplies KIT Dispense based on patient and insurance preference. Use up to four times daily as directed. (FOR ICD-9 250.00, 250.01). 1 each 0   cetirizine (ZYRTEC) 10 MG tablet Take 10 mg by mouth daily.     glipiZIDE (GLUCOTROL XL) 5 MG 24 hr tablet Take 5 mg by mouth daily.     hydrochlorothiazide (HYDRODIURIL) 25 MG tablet Take 25 mg by  mouth daily.     losartan (COZAAR) 25 MG tablet Take 25 mg by mouth daily.     metFORMIN (GLUCOPHAGE XR) 500 MG 24 hr tablet Take 1 tablet (500 mg total) by mouth in the morning and at bedtime. 60 tablet 11   montelukast (SINGULAIR) 10 MG tablet Take 10 mg by mouth daily.     omeprazole (PRILOSEC OTC) 20 MG tablet Take 20 mg by mouth daily.     potassium chloride SA (K-DUR,KLOR-CON) 20 MEQ tablet Take 20 mEq by mouth daily.     VITAMIN D, CHOLECALCIFEROL, PO Take 1 tablet by mouth daily.     Evolocumab (REPATHA SURECLICK) 010 MG/ML SOAJ INJECT 1 DOSE INTO SKIN EVERY 14 DAYS (Patient not taking: Reported on 04/01/2022) 6 mL 3   No current facility-administered medications on file prior to visit.    LABS/IMAGING: No results found for this or any previous visit (from the past 48 hour(s)). No results found.  LIPID PANEL:    Component Value Date/Time   CHOL 229 (H) 03/12/2022 1023   TRIG 362 (H) 03/12/2022 1023   HDL 43 03/12/2022 1023   CHOLHDL 5.3 (H) 03/12/2022 1023   LDLCALC 122 (H) 03/12/2022 1023   LDLDIRECT 114 (H) 03/12/2022 1023    WEIGHTS: Wt Readings from Last 3 Encounters:  04/01/22 196 lb 3.2 oz (89 kg)  06/05/21 193 lb 9.6 oz (87.8 kg)  04/29/20 194 lb (88 kg)    VITALS: BP 130/82   Pulse 79   Ht 5' 3.5" (1.613 m)   Wt 196 lb 3.2 oz (89 kg)   SpO2 97%   BMI 34.21 kg/m   EXAM: General appearance: alert, no distress and moderately obese Neck: no carotid bruit, no JVD and thyroid not enlarged, symmetric, no tenderness/mass/nodules Lungs: clear to auscultation bilaterally Heart: regular rate and rhythm, S1, S2 normal, no murmur, click, rub or gallop Abdomen: soft, non-tender; bowel sounds normal; no masses,  no organomegaly and obese Extremities: extremities normal, atraumatic, no cyanosis or edema and Significant lipedema Pulses: 2+ and symmetric Skin: Skin color, texture, turgor normal. No rashes or lesions Neurologic: Grossly normal Psych:  Pleasant  EKG: Deferred  ASSESSMENT: Significant dyslipidemia-possible familial hyperlipidemia or familial combined hyperlipidemia Progressive fatigue, exercise intolerance and chest discomfort New onset diabetes-hemoglobin A1c 7.4 Essential hypertension Moderate obesity Strong family history of coronary disease Statin intolerance-myalgias  PLAN: 1.   Holly Higgins has had recent increase in her triglycerides.  This is primary dietarily related.  She needs to continue to watch carbohydrates but also saturated fats and needs more activity and exercise.  She had injured her back but she says is improving after working with a Restaurant manager, fast food.  We will plan repeat lipids in about 3 months.  If her numbers do not improve, we may need to  add additional therapy such as a fibrate or omega-3.  Pixie Casino, MD, [facility], [place] Director of the Advanced Lipid Disorders &  Cardiovascular Risk Reduction Clinic Diplomate of the American Board of Clinical Lipidology Attending Cardiologist  Direct Dial: [phone]  Fax: [phone]  Website:  [email or web address] 04/01/2022, 3:53 PM

## 2022-06-03 ENCOUNTER — Other Ambulatory Visit: Payer: Self-pay | Admitting: *Deleted

## 2022-06-03 ENCOUNTER — Other Ambulatory Visit: Payer: Self-pay | Admitting: Internal Medicine

## 2022-06-03 DIAGNOSIS — E7849 Other hyperlipidemia: Secondary | ICD-10-CM

## 2022-07-20 LAB — LIPID PANEL
Chol/HDL Ratio: 5.2 ratio — ABNORMAL HIGH (ref 0.0–4.4)
Cholesterol, Total: 229 mg/dL — ABNORMAL HIGH (ref 100–199)
HDL: 44 mg/dL (ref >39)
LDL Chol Calc (NIH): 109 mg/dL — ABNORMAL HIGH (ref 0–99)
Triglycerides: 443 mg/dL — ABNORMAL HIGH (ref 0–149)
VLDL Cholesterol Cal: 76 mg/dL — ABNORMAL HIGH (ref 5–40)

## 2022-07-21 ENCOUNTER — Encounter: Payer: Self-pay | Admitting: Internal Medicine

## 2022-07-21 ENCOUNTER — Telehealth: Payer: Self-pay | Admitting: Internal Medicine

## 2022-07-21 ENCOUNTER — Ambulatory Visit: Payer: Commercial Managed Care - PPO | Attending: Internal Medicine | Admitting: Internal Medicine

## 2022-07-21 VITALS — Wt 198.0 lb

## 2022-07-21 DIAGNOSIS — E119 Type 2 diabetes mellitus without complications: Secondary | ICD-10-CM

## 2022-07-21 DIAGNOSIS — R931 Abnormal findings on diagnostic imaging of heart and coronary circulation: Secondary | ICD-10-CM

## 2022-07-21 DIAGNOSIS — I10 Essential (primary) hypertension: Secondary | ICD-10-CM | POA: Diagnosis not present

## 2022-07-21 DIAGNOSIS — E7849 Other hyperlipidemia: Secondary | ICD-10-CM

## 2022-07-21 DIAGNOSIS — E1169 Type 2 diabetes mellitus with other specified complication: Secondary | ICD-10-CM

## 2022-07-21 MED ORDER — NEXLETOL 180 MG PO TABS: 1.0000 | ORAL_TABLET | Freq: Every day | ORAL | 3 refills | Status: DC

## 2022-07-21 NOTE — Patient Instructions (Signed)
Medication Instructions:   We are working on an authorization for [employer] '180mg'$  daily  Co-pay card: [email or web address]  *If you need a refill on your cardiac medications before your next appointment, please call your pharmacy*   Lab Work:  FASTING lab work in 6 months to check cholesterol   If you have labs (blood work) drawn today and your tests are completely normal, you will receive your results only by: [place] (if you have MyChart) OR A paper copy in the mail If you have any lab test that is abnormal or we need to change your treatment, we will call you to review the results.   Follow-Up: At [facility], you and your health needs are our priority.  As part of our continuing mission to provide you with exceptional heart care, we have created designated Provider Care Teams.  These Care Teams include your primary Cardiologist (physician) and Advanced Practice Providers (APPs -  Physician Assistants and Nurse Practitioners) who all work together to provide you with the care you need, when you need it.  We recommend signing up for the patient portal called "MyChart".  Sign up information is provided on this After Visit Summary.  MyChart is used to connect with patients for Virtual Visits (Telemedicine).  Patients are able to view lab/test results, encounter notes, upcoming appointments, etc.  Non-urgent messages can be sent to your provider as well.   To learn more about what you can do with MyChart, go to [email or web address].    Your next appointment:   6 month(s)  The format for your next appointment:   In Person or Video Visit  Provider:   Lyman Bishop MD

## 2022-07-21 NOTE — Telephone Encounter (Signed)
Patient called to review e-visit instructions. Patient aware that the following changes have been made: start nexletol - samples/if approved with insurance Patient aware that they will need the following labs: fasting lipid panel in 6 months Patient aware that they will need the following test(s): none  Recall for 6 months entered.   Called patient to explain I will work on [employer]. Need to know if she has taken atorvastatin and/or rosuvastatin specifically, per PA questions in CMM. She will try to research this - states it was over 20 years ago and she took a statin for 1 week only. Our chart notes only state "statins".   AVS and lab order mailed

## 2022-07-21 NOTE — Progress Notes (Signed)
Virtual Visit via Video Note   Because of [name]'s co-morbid illnesses, she is at least at moderate risk for complications without adequate follow up.  This format is felt to be most appropriate for this patient at this time.  All issues noted in this document were discussed and addressed.  A limited physical exam was performed with this format.  Please refer to the patient's chart for her consent to telehealth for [facility].      Date:  07/21/2022   ID:  Holly Higgins, DOB 06-Sep-1961, MRN [id number] The patient was identified using 2 identifiers.  Evaluation Performed:  Follow-Up Visit  Patient Location:  [address] [place] [address]  Provider location:   [address] [place], [place] [address]  PCP:  Glenda Chroman, MD  Cardiologist:  None Electrophysiologist:  None   Chief Complaint:  Manage dyslipidemia  History of Present Illness:    Holly Higgins is a 61 y.o. female who presents via audio/video conferencing for a telehealth visit today.  This is a pleasant 61 year old female kindly referred by Dr. Gerarda Fraction for evaluation of high cholesterol who also has had progressive shortness of breath, chest pain and fatigue for the past 6 months.  She has a strong family history of coronary disease in her father and grandfather.  She reports longstanding high cholesterol but unfortunately has been intolerant to statins causing myalgias and joint pain.  She is currently on no therapy.  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  Hemoglobin A1c was also noted to be 7.4 although she carries no diagnosis of diabetes this would indicate a new diagnosis.  She did have an episode of elevated blood glucose which was treated by her husband who is also diabetic and paramedic as he provided her with some of his insulin to lower the  sugar.  I did strongly advise against this.  Holly Higgins reports that she has had some progressive dyspnea and fatigue over the past 16month with decreased energy and exercise tolerance, concerning for possible coronary disease.  Diet is not ideal and could be improved with decreases in sugars and saturated fats.  In addition weight loss would be helpful.  06/05/2021  Holly Higgins returns today for follow-up.  She continues on Repatha and reports no significant issues with the medication.  Recently she had come off of metformin but restarted it after having some vision problems.  Hemoglobin A1c is up slightly to 7.6 from 7.4.  Cholesterol however has gone up.  Total now 238, HDL 43, triglycerides 474 and LDL 113, up from 102.  The triglycerides are probably primarily diet related.  She says her diet has been pretty much horrible and her activity level has been near 0 after injuring her back recently.  She is working with a cRestaurant manager, fast foodon that.  04/01/2022  Holly Higgins returns today for follow up. She say she has run out of her Repatha - labs do show an increase in her LDL cholesterol from 68-1 22 with total cholesterol 229, triglycerides 362 and HDL 43.  Unfortunately, the medicine was not covered by her insurance.  She previously could not tolerate statins.  07/21/2022    Prior CV studies:   The following studies were reviewed today:  Chart reviewed  PMHx:  Past Medical History:  Diagnosis Date   Anemia  GERD (gastroesophageal reflux disease)    Hypercholesterolemia    Hypertension    Type 2 diabetes mellitus ([place])     Past Surgical History:  Procedure Laterality Date   BLADDER SUSPENSION     CHOLECYSTECTOMY     remote past   COLONOSCOPY N/A 08/28/2013   Procedure: COLONOSCOPY;  Surgeon: Danie Binder, MD;  [facility];  Service: Endoscopy;  Laterality: N/A;  9:00   ENDOMETRIAL ABLATION      FAMHx:  Family History  Problem Relation Age of Onset   Colon cancer Neg Hx      SOCHx:   reports that she has never smoked. She does not have any smokeless tobacco history on file. She reports that she does not drink alcohol and does not use drugs.  ALLERGIES:  Allergies  Allergen Reactions   Erythromycin Other (See Comments)    Chest Pain    Latex Itching and Swelling   Morphine And Related Nausea And Vomiting   Statins     Muscle aches, joint pain   Banana Nausea And Vomiting, Swelling and Rash   Penicillins Swelling and Rash    MEDS:  Current Meds  Medication Sig   atenolol (TENORMIN) 25 MG tablet Take 25 mg by mouth daily.   blood glucose meter kit and supplies KIT Dispense based on patient and insurance preference. Use up to four times daily as directed. (FOR ICD-9 250.00, 250.01).   cetirizine (ZYRTEC) 10 MG tablet Take 10 mg by mouth daily.   ezetimibe (ZETIA) 10 MG tablet Take 1 tablet (10 mg total) by mouth daily.   glipiZIDE (GLUCOTROL XL) 5 MG 24 hr tablet Take 5 mg by mouth daily.   hydrochlorothiazide (HYDRODIURIL) 25 MG tablet Take 25 mg by mouth daily.   losartan (COZAAR) 25 MG tablet Take 25 mg by mouth daily.   metFORMIN (GLUCOPHAGE XR) 500 MG 24 hr tablet Take 1 tablet (500 mg total) by mouth in the morning and at bedtime.   montelukast (SINGULAIR) 10 MG tablet Take 10 mg by mouth daily.   omeprazole (PRILOSEC OTC) 20 MG tablet Take 20 mg by mouth daily.   potassium chloride SA (K-DUR,KLOR-CON) 20 MEQ tablet Take 20 mEq by mouth daily.   VITAMIN D, CHOLECALCIFEROL, PO Take 4 tablets by mouth daily.     ROS: Pertinent items noted in HPI and remainder of comprehensive ROS otherwise negative.  Labs/Other Tests and Data Reviewed:    Recent Labs: No results found for requested labs within last 365 days.   Recent Lipid Panel Lab Results  Component Value Date/Time   CHOL 229 (H) 07/19/2022 09:15 AM   TRIG 443 (H) 07/19/2022 09:15 AM   HDL 44 07/19/2022 09:15 AM   CHOLHDL 5.2 (H) 07/19/2022 09:15 AM   LDLCALC 109 (H) 07/19/2022  09:15 AM   LDLDIRECT 114 (H) 03/12/2022 10:23 AM    Wt Readings from Last 3 Encounters:  07/21/22 198 lb (89.8 kg)  04/01/22 196 lb 3.2 oz (89 kg)  06/05/21 193 lb 9.6 oz (87.8 kg)     Exam:    Vital Signs:  Wt 198 lb (89.8 kg)   BMI 34.52 kg/m    General appearance: alert and no distress Lungs: no wheezing Abdomen: obese Extremities: extremities normal, atraumatic, no cyanosis or edema Neurologic: Grossly normal  ASSESSMENT & PLAN:    Significant dyslipidemia-possible familial hyperlipidemia or familial combined hyperlipidemia Progressive fatigue, exercise intolerance and chest discomfort New onset diabetes-hemoglobin A1c 7.4 Essential hypertension Moderate obesity  Strong family history of coronary disease Statin intolerance-myalgias Mild nonobstructive coronary disease by coronary CT, CAC score 11 (77th percentile)-05/2020  Holly Higgins fortunately was not able to continue on Repatha due to cost issues however had excellent lipid control with an LDL of 68 last year.  Her cholesterol has since climbed now total 229, triglycerides 443, HDL 44 and LDL 109.  This is on ezetimibe.  Its not well-tolerated as she has some GI issues.  We discussed about the possibility of Nexletol or possibly Nexlizet.  We will try to provide her with samples however cost may be an issue.  I do not think she will qualify for Leqvio.  Options seems somewhat limited.  Patient Risk:   After full review of this patients clinical status, I feel that they are at least moderate risk at this time.  Time:   Today, I have spent 15 minutes with the patient with telehealth technology discussing dyslipidemia.     Medication Adjustments/Labs and Tests Ordered: Current medicines are reviewed at length with the patient today.  Concerns regarding medicines are outlined above.   Tests Ordered: No orders of the defined types were placed in this encounter.   Medication Changes: No orders of the defined types were  placed in this encounter.   Disposition:  in 6 month(s)  Pixie Casino, MD, [facility], [place] Director of the Advanced Lipid Disorders &  Cardiovascular Risk Reduction Clinic Diplomate of the American Board of Clinical Lipidology Attending Cardiologist  Direct Dial: [phone]  Fax: [phone]  Website:  [email or web address]  Pixie Casino, MD  07/21/2022 9:07 AM

## 2022-07-28 ENCOUNTER — Telehealth: Payer: Self-pay

## 2022-07-28 NOTE — Telephone Encounter (Signed)
PA Case ID: [id number] for Nexletol submitted and approved by [facility], Key: [id number], Rx #: [id number], Coverage Start Date:06/28/2022;Coverage End Date:07/28/2023.

## 2022-07-29 MED ORDER — NEXLETOL 180 MG PO TABS: 1.0000 | ORAL_TABLET | Freq: Every day | ORAL | 5 refills | Status: DC

## 2022-07-29 NOTE — Addendum Note (Signed)
Addended by: Fidel Levy on: 07/29/2022 09:52 AM   Modules accepted: Orders

## 2022-07-29 NOTE — Telephone Encounter (Signed)
Called patient. Notified her Nexletol is approved. No samples at [facility] location. Will check at [facility] and call her back

## 2022-07-29 NOTE — Telephone Encounter (Signed)
Approved on October 11 CaseId:[id number]; Status:Approved Coverage Start Date:06/28/2022; Coverage End Date:07/28/2023;

## 2022-07-29 NOTE — Telephone Encounter (Signed)
Spoke with patient. Explained there are no samples. She is OK with 30-day supply being sent to [facility] to pick up. Advised how to obtain co-pay card online.

## 2022-09-28 ENCOUNTER — Telehealth: Payer: Self-pay | Admitting: Internal Medicine

## 2022-09-28 NOTE — Telephone Encounter (Signed)
Pt states she went to her family doctor the other day and her cholesterol is still not under control. She wants to know if there are other cholesterol medication options she can try, she states she was never able to get on any medication because of some insurance issues

## 2022-09-28 NOTE — Telephone Encounter (Signed)
Returned call to patient who states that she recently got blood work done at her PCP office for her annual physical and reports that her cholesterol was still elevated. Patient reports that she never got the [facility]- advised patient it looks as if patients prior Josem Kaufmann was approved until October of next year- advised patient to reach out to her pharmacy. Patient also reports she had stopped Zetia as well due to GI symptoms. Patient would like to know if she should go ahead and start the Nexletol. Advised patient I would forward to MD for advice. Patient verbalized understanding.   Also advised patient to have recent labs faxed over to office- fax number provided to patient. Patient verbalized understanding.

## 2022-09-29 MED ORDER — NEXLETOL 180 MG PO TABS: 1.0000 | ORAL_TABLET | Freq: Every day | ORAL | 5 refills | Status: DC

## 2022-09-29 NOTE — Telephone Encounter (Signed)
Spoke with patient of Dr. Debara Pickett  She reports she has been unable to get prescription from [facility] as they have been unable to order/obtain it Patient said she wasn't aware of this really until now Advised will send in Rx to a different pharmacy - she suggested [facility] in [place] to call us back if she is unable to get Nexletol from this location and will then need to try [facility] or [facility]

## 2022-09-29 NOTE — Telephone Encounter (Signed)
Pixie Casino, MD  [place], Eliezer Lofts B, RN14 hours ago (5:17 PM)    Yes .Marland Kitchen She was approved for Nexletol and I intended for her to get it filled and start taking it. Would need a repeat cholesterol test in 3 months.  -Mali

## 2022-10-06 ENCOUNTER — Telehealth: Payer: Self-pay | Admitting: Internal Medicine

## 2022-10-06 DIAGNOSIS — E7849 Other hyperlipidemia: Secondary | ICD-10-CM

## 2022-10-06 NOTE — Telephone Encounter (Signed)
Will route to MD to advise.   Thank you!

## 2022-10-06 NOTE — Telephone Encounter (Signed)
Spoke with patient of Dr. Debara Pickett who cannot afford Nexletol.   She would be willing to try a different statin such as Crestor -- states her sisters are tolerating this well.   She would like to try this medication

## 2022-10-06 NOTE — Telephone Encounter (Signed)
Ok .. Would recommend Crestor 20 mg daily. Repeat lipid in 3 months  -Mali

## 2022-10-06 NOTE — Telephone Encounter (Signed)
Pt c/o medication issue:  1. Name of Medication: Bempedoic Acid (NEXLETOL) 180 MG TABS   2. How are you currently taking this medication (dosage and times per day)?   Take 1 tablet by mouth daily.    3. Are you having a reaction (difficulty breathing--STAT)?   4. What is your medication issue? Patient states she can't afford this medication, it will cost her almost $200 for it.  She wants to know if there is something else that can be given to her.  She has a couple of different options that her sisters was able to use.  She states both are on Crestor and one is on Crestor and Tricor. She wants to know if that would be an option for her.

## 2022-10-07 MED ORDER — ROSUVASTATIN CALCIUM 20 MG PO TABS: 20.0000 mg | ORAL_TABLET | Freq: Every day | ORAL | 3 refills | Status: AC

## 2022-10-07 NOTE — Telephone Encounter (Signed)
Patient aware of med change recommendation from MD.  Rx(s) sent to pharmacy electronically. Lab order mailed

## 2023-01-29 LAB — LIPID PANEL
Chol/HDL Ratio: 4.3 ratio (ref 0.0–4.4)
Cholesterol, Total: 170 mg/dL (ref 100–199)
HDL: 40 mg/dL (ref >39)
LDL Chol Calc (NIH): 74 mg/dL (ref 0–99)
Triglycerides: 351 mg/dL — ABNORMAL HIGH (ref 0–149)
VLDL Cholesterol Cal: 56 mg/dL — ABNORMAL HIGH (ref 5–40)

## 2023-02-01 ENCOUNTER — Encounter: Payer: Self-pay | Admitting: Internal Medicine

## 2023-05-04 ENCOUNTER — Other Ambulatory Visit: Payer: Self-pay | Admitting: Obstetrics and Gynecology

## 2023-05-04 DIAGNOSIS — E2839 Other primary ovarian failure: Secondary | ICD-10-CM

## 2023-06-29 ENCOUNTER — Telehealth: Payer: Self-pay | Admitting: Pharmacy Technician

## 2023-06-29 ENCOUNTER — Other Ambulatory Visit (HOSPITAL_COMMUNITY): Payer: Self-pay

## 2023-06-29 NOTE — Telephone Encounter (Signed)
Pharmacy Patient Advocate Encounter   Received notification from CoverMyMeds that prior authorization for nexletol is required/requested.   Insurance verification completed.   The patient is insured through [employer] .   Per test claim: PA required; PA submitted to [employer] via CoverMyMeds Key/confirmation #/EOC [id number] Status is pending

## 2023-07-01 ENCOUNTER — Other Ambulatory Visit (HOSPITAL_COMMUNITY): Payer: Self-pay

## 2023-07-01 NOTE — Telephone Encounter (Signed)
Pharmacy Patient Advocate Encounter  Received notification from [employer] that Prior Authorization for nexletol has been APPROVED from 07/01/23 to 06/30/24. Ran test claim, Copay is $72.90. This test claim was processed through [facility]- copay amounts may vary at other pharmacies due to pharmacy/plan contracts, or as the patient moves through the different stages of their insurance plan.   PA #/Case ID/Reference #: [id number]

## 2023-08-03 ENCOUNTER — Encounter: Payer: Self-pay | Admitting: *Deleted

## 2024-01-11 ENCOUNTER — Ambulatory Visit
Admission: RE | Admit: 2024-01-11 | Discharge: 2024-01-11 | Disposition: A | Discharge status: Home / Self Care | Payer: Commercial Managed Care - PPO | Source: Ambulatory Visit | Attending: Obstetrics and Gynecology | Admitting: Obstetrics and Gynecology

## 2024-01-11 DIAGNOSIS — E2839 Other primary ovarian failure: Secondary | ICD-10-CM

## 2024-06-01 ENCOUNTER — Other Ambulatory Visit (HOSPITAL_COMMUNITY): Payer: Self-pay
